# Patient Record
Sex: Female | Born: 1995
Health system: Southern US, Community
[De-identification: ages and names within clinical notes are randomized; demographics above are authoritative.]

## PROBLEM LIST (undated history)

## (undated) DIAGNOSIS — O24419 Gestational diabetes mellitus in pregnancy, unspecified control: Secondary | ICD-10-CM

## (undated) HISTORY — DX: Gestational diabetes mellitus in pregnancy, unspecified control: O24.419

---
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
## 2015-01-03 ENCOUNTER — Encounter: Payer: Self-pay | Admitting: Obstetrics & Gynecology

## 2015-01-03 ENCOUNTER — Ambulatory Visit (INDEPENDENT_AMBULATORY_CARE_PROVIDER_SITE_OTHER): Payer: Medicaid Other | Admitting: Obstetrics & Gynecology

## 2015-01-03 VITALS — BP 127/72 | HR 83 | Temp 98.8°F | Ht 62.0 in | Wt 159.2 lb

## 2015-01-03 DIAGNOSIS — Z01812 Encounter for preprocedural laboratory examination: Secondary | ICD-10-CM | POA: Diagnosis present

## 2015-01-03 DIAGNOSIS — Z3202 Encounter for pregnancy test, result negative: Secondary | ICD-10-CM

## 2015-01-03 DIAGNOSIS — Z30430 Encounter for insertion of intrauterine contraceptive device: Secondary | ICD-10-CM | POA: Diagnosis not present

## 2015-01-03 LAB — POCT PREGNANCY, URINE: PREG TEST UR: NEGATIVE

## 2015-01-03 MED ORDER — LEVONORGESTREL 20 MCG/24HR IU IUD
INTRAUTERINE_SYSTEM | Freq: Once | INTRAUTERINE | Status: AC
  Administered 2015-01-03: 1 via INTRAUTERINE

## 2015-01-03 NOTE — Progress Notes (Signed)
   Subjective:    Patient ID: [name] Alvarez, female    DOB: 07/04/1996, 19 y.o.   MRN: [id number]  HPI  This S AA P2 is here for Mirena insertion. She has not had sex for 2 weeks.  Review of Systems     Objective:   Physical Exam  UPT negative, consent signed, Time out procedure done. Cervix prepped with betadine and grasped with a single tooth tenaculum. Mirena was easily placed and the strings were cut to 3-4 cm. Uterus sounded to 9 cm. She tolerated the procedure well.        Assessment & Plan:  Contraception- Mirena RTC 4 weeks/prn sooner

## 2015-01-31 ENCOUNTER — Ambulatory Visit: Payer: Medicaid Other | Admitting: Obstetrics and Gynecology

## 2015-02-08 ENCOUNTER — Emergency Department (HOSPITAL_COMMUNITY)
Admission: EM | Admit: 2015-02-08 | Discharge: 2015-02-09 | Disposition: A | Discharge status: Home / Self Care | Payer: Medicaid Other | Attending: Emergency Medicine | Admitting: Emergency Medicine

## 2015-02-08 ENCOUNTER — Encounter (HOSPITAL_COMMUNITY): Payer: Self-pay | Admitting: Emergency Medicine

## 2015-02-08 DIAGNOSIS — Z3202 Encounter for pregnancy test, result negative: Secondary | ICD-10-CM | POA: Diagnosis not present

## 2015-02-08 DIAGNOSIS — B349 Viral infection, unspecified: Secondary | ICD-10-CM | POA: Diagnosis not present

## 2015-02-08 DIAGNOSIS — J029 Acute pharyngitis, unspecified: Secondary | ICD-10-CM | POA: Diagnosis not present

## 2015-02-08 LAB — POC URINE PREG, ED: Preg Test, Ur: NEGATIVE

## 2015-02-08 LAB — RAPID STREP SCREEN (MED CTR MEBANE ONLY): STREPTOCOCCUS, GROUP A SCREEN (DIRECT): NEGATIVE

## 2015-02-08 MED ORDER — ACETAMINOPHEN 325 MG PO TABS
650.0000 mg | ORAL_TABLET | Freq: Once | ORAL | Status: AC
  Administered 2015-02-08: 650 mg via ORAL
  Filled 2015-02-08: qty 2

## 2015-02-08 NOTE — ED Provider Notes (Signed)
CSN: [id number]     Arrival date & time 02/08/15  1951 History  This chart was scribed for [employer], working with Rolland Porter, MD by Placido Sou, ED Scribe. This patient was seen in room TR04C/TR04C and the patient's care was started at 11:22 PM.     Chief Complaint  Patient presents with  . Sore Throat    The history is provided by the patient. No language interpreter was used.    HPI Comments: [name] Alvarez is a 19 y.o. female who presents to the Emergency Department complaining of a constant, moderate, sore throat with onset this morning (TMAX 101.8). Reported that she's been experiencing nasal congestion as well. Pt denies taking any medication before arrival. Pt's daughter was seen at [facility] 2 days ago and was diagnosed with URI. Pt denies coughing, neck pain, neck stiffness, trouble swallowing, ear pain, CP, SOB, recent travels, blurred vision, sudden loss of vision, ear drainage, eye drainage.  PCP Dr. Julio Sicks  History reviewed. No pertinent past medical history. Past Surgical History  Procedure Laterality Date  . Cesarean section  2013    [place]  . Cesarean section N/A 11/13/2014    Procedure: CESAREAN SECTION;  Surgeon: Catalina Antigua, MD;  Location: [facility];  Service: Obstetrics;  Laterality: N/A;   No family history on file. History  Substance Use Topics  . Smoking status: Never Smoker   . Smokeless tobacco: Never Used  . Alcohol Use: No   OB History    Gravida Para Term Preterm AB TAB SAB Ectopic Multiple Living   0 0 0 0 0 0 2     Review of Systems  Constitutional: Positive for fever. Negative for chills.  HENT: Positive for congestion and sore throat. Negative for ear discharge, ear pain, sinus pressure and trouble swallowing.   Eyes: Negative for discharge.  Respiratory: Negative for cough and shortness of breath.   Cardiovascular: Negative for chest pain.  Musculoskeletal: Negative for neck pain and neck stiffness.      Allergies   Review of patient's allergies indicates no known allergies.  Home Medications   Prior to Admission medications   Medication Sig Start Date End Date Taking? Authorizing Provider  acetaminophen (TYLENOL) 325 MG tablet Take 1 tablet (325 mg total) by mouth every 8 (eight) hours as needed for fever. 02/09/15   Noble Cicalese, PA-C   BP 113/69 mmHg  Pulse 97  Temp(Src) 99.7 F (37.6 C) (Oral)  Resp 18  Ht  (1.575 m)  Wt 160 lb (72.576 kg)  BMI 29.26 kg/m2  SpO2 100% Physical Exam  Constitutional: She is oriented to person, place, and time. She appears well-developed and well-nourished. No distress.  HENT:  Head: Normocephalic and atraumatic.  Right Ear: Hearing, tympanic membrane, external ear and ear canal normal. No mastoid tenderness. Tympanic membrane is not injected, not scarred, not perforated, not erythematous, not retracted and not bulging.  Left Ear: Hearing, tympanic membrane, external ear and ear canal normal. No mastoid tenderness. Tympanic membrane is not injected, not scarred, not perforated, not erythematous, not retracted and not bulging.  Mouth/Throat: Oropharynx is clear and moist. No oropharyngeal exudate.  Mild erythema identified to the posterior oropharynx. Mild erythema identified to the tonsils bilaterally. Negative tonsillar adenopathy. Negative exudate noted. Negative petechiae noted to the soft palate. Uvula midline was matched elevation. Negative uvula deviation. Negative trismus. Negative sublingual lesions.  Eyes: Conjunctivae and EOM are normal. Pupils are equal, round, and reactive to light.  Right eye exhibits no discharge. Left eye exhibits no discharge.  Neck: Normal range of motion. Neck supple. No tracheal deviation present.  Negative neck stiffness Negative nuchal rigidity Negative cervical lymphadenopathy Negative meningeal signs  Cardiovascular: Normal rate, regular rhythm and normal heart sounds.  Exam reveals no friction rub.   No murmur  heard. Pulses:      Radial pulses are 2+ on the right side, and 2+ on the left side.  Pulmonary/Chest: Effort normal and breath sounds normal. No respiratory distress. She has no wheezes. She has no rales.  Patient is able to speak in full sentences that difficulty Negative use of accessory muscles Negative stridor  Musculoskeletal: Normal range of motion.  Lymphadenopathy:    She has no cervical adenopathy.  Neurological: She is alert and oriented to person, place, and time. No cranial nerve deficit. She exhibits normal muscle tone. Coordination normal. GCS eye subscore is 4. GCS verbal subscore is 5. GCS motor subscore is 6.  Skin: Skin is warm and dry. No rash noted. She is not diaphoretic. No erythema.  Psychiatric: She has a normal mood and affect. Her behavior is normal. Thought content normal.  Nursing note and vitals reviewed.   ED Course  Procedures  DIAGNOSTIC STUDIES: Oxygen Saturation is 100% on RA, normal by my interpretation.    COORDINATION OF CARE: 11:29 PM Discussed treatment plan with pt at bedside rest, hydration and treating pain with ibuprofen and tylenol. Pt agreed to plan.  Results for orders placed or performed during the hospital encounter of 02/08/15  Rapid strep screen  Result Value Ref Range   Streptococcus, Group A Screen (Direct) NEGATIVE NEGATIVE  POC urine preg, ED ([facility])  Result Value Ref Range   Preg Test, Ur NEGATIVE NEGATIVE    Labs Review Labs Reviewed  RAPID STREP SCREEN ([facility])  CULTURE, GROUP A STREP  POC URINE PREG, ED    Imaging Review No results found.   EKG Interpretation None      MDM   Final diagnoses:  Sore throat  Viral syndrome    Medications  acetaminophen (TYLENOL) tablet 650 mg (650 mg Oral Given 02/08/15 2010)    Filed Vitals:   02/08/15 1959 02/08/15 2352  BP: 113/71 113/69  Pulse: 108 97  Temp: 101.4 F (38.6 C) 99.7 F (37.6 C)  TempSrc: Oral Oral  Resp: 16 18  Height: 5\' 2"  (1.575  m)   Weight: 160 lb (72.576 kg)   SpO2: 100% 100%   I personally performed the services described in this documentation, which was scribed in my presence. The recorded information has been reviewed and is accurate.   Patient presenting to the ED with nasal congestion, sore throat and fever that started earlier this morning. For that her maximum temperature was 101.25F. Stated that she has used nothing for fever control at home. Stated her daughter has similar symptoms as recently diagnosed with a viral upper respiratory infection 2 days ago. Urine pregnancy negative. Rapid strep test negative. Doubt peritonsillar abscess. Doubt retropharyngeal abscess. Doubt meningitis. Doubt Ludwig's angina. Doubt streptococcal pharyngitis. Mild erythema identified to the oropharynx and posterior oropharynx-negative exudate or petechiae noted on examination. Negative trismus. Patient mildly febrile upon arrival to the ED, patient given Tylenol with relief of fever. Patient stable, afebrile. Patient not septic appearing. Negative signs of respiratory distress. Suspicion to be viral syndrome, upper respiratory infection. Discharged patient. Discharge patient with fever control medications. Discussed with patient to rest and stay hydrated. Referred  patient to PCP. Discussed with patient to closely monitor symptoms and if symptoms are to worsen or change to report back to the ED - strict return instructions given.  Patient agreed to plan of care, understood, all questions answered.    Raymon Mutton, PA-C 02/09/15 0031  Rolland Porter, MD 02/16/15 1330

## 2015-02-08 NOTE — ED Notes (Signed)
Pt. reports sore throat with fever onset last night with mild headache . Respirations unlabored /airway intact .

## 2015-02-09 MED ORDER — ACETAMINOPHEN 325 MG PO TABS: 325.0000 mg | ORAL_TABLET | Freq: Three times a day (TID) | ORAL | Status: DC | PRN

## 2015-02-09 NOTE — Discharge Instructions (Signed)
Please call your doctor for a followup appointment within 24-48 hours. When you talk to your doctor please let them know that you were seen in the emergency department and have them acquire all of your records so that they can discuss the findings with you and formulate a treatment plan to fully care for your new and ongoing problems. Please follow-up with her primary care provider Please rest and stay hydrated Please closely monitor your fever and use Tylenol as needed. Please continue to monitor symptoms closely and if symptoms are to worsen or change (fever greater than 101, chills, sweating, nausea, vomiting, chest pain, shortness of breathe, difficulty breathing, weakness, numbness, tingling, worsening or changes to pain pattern, headache, dizziness, blurred vision, sudden loss of vision, neck pain, neck stiffness, inability to swallow, coughing up blood, cough, inability swallow) please report back to the Emergency Department immediately.   Sore Throat A sore throat is pain, burning, irritation, or scratchiness of the throat. There is often pain or tenderness when swallowing or talking. A sore throat may be accompanied by other symptoms, such as coughing, sneezing, fever, and swollen neck glands. A sore throat is often the first sign of another sickness, such as a cold, flu, strep throat, or mononucleosis (commonly known as mono). Most sore throats go away without medical treatment. CAUSES  The most common causes of a sore throat include:  A viral infection, such as a cold, flu, or mono.  A bacterial infection, such as strep throat, tonsillitis, or whooping cough.  Seasonal allergies.  Dryness in the air.  Irritants, such as smoke or pollution.  Gastroesophageal reflux disease (GERD). HOME CARE INSTRUCTIONS   Only take over-the-counter medicines as directed by your caregiver.  Drink enough fluids to keep your urine clear or pale yellow.  Rest as needed.  Try using throat sprays,  lozenges, or sucking on hard candy to ease any pain (if older than 4 years or as directed).  Sip warm liquids, such as broth, herbal tea, or warm water with honey to relieve pain temporarily. You may also eat or drink cold or frozen liquids such as frozen ice pops.  Gargle with salt water (mix 1 tsp salt with 8 oz of water).  Do not smoke and avoid secondhand smoke.  Put a cool-mist humidifier in your bedroom at night to moisten the air. You can also turn on a hot shower and sit in the bathroom with the door closed for 5-10 minutes. SEEK IMMEDIATE MEDICAL CARE IF:  You have difficulty breathing.  You are unable to swallow fluids, soft foods, or your saliva.  You have increased swelling in the throat.  Your sore throat does not get better in 7 days.  You have nausea and vomiting.  You have a fever or persistent symptoms for more than 2-3 days.  You have a fever and your symptoms suddenly get worse. MAKE SURE YOU:   Understand these instructions.  Will watch your condition.  Will get help right away if you are not doing well or get worse. Document Released: 10/09/2004 Document Revised: 08/18/2012 Document Reviewed: 05/09/2012 [facility] Patient Information 2015 [employer], [place]. This information is not intended to replace advice given to you by your health care provider. Make sure you discuss any questions you have with your health care provider.  Viral Infections A virus is a type of germ. Viruses can cause:  Minor sore throats.  Aches and pains.  Headaches.  Runny nose.  Rashes.  Watery eyes.  Tiredness.  Coughs.  Loss of appetite.  Feeling sick to your stomach (nausea).  Throwing up (vomiting).  Watery poop (diarrhea). HOME CARE   Only take medicines as told by your doctor.  Drink enough water and fluids to keep your pee (urine) clear or pale yellow. Sports drinks are a good choice.  Get plenty of rest and eat healthy. Soups and broths with crackers or  rice are fine. GET HELP RIGHT AWAY IF:   You have a very bad headache.  You have shortness of breath.  You have chest pain or neck pain.  You have an unusual rash.  You cannot stop throwing up.  You have watery poop that does not stop.  You cannot keep fluids down.  You or your child has a temperature by mouth above 102 F (38.9 C), not controlled by medicine.  Your baby is older than 3 months with a rectal temperature of 102 F (38.9 C) or higher.  Your baby is 71 months old or younger with a rectal temperature of 100.4 F (38 C) or higher. MAKE SURE YOU:   Understand these instructions.  Will watch this condition.  Will get help right away if you are not doing well or get worse. Document Released: 08/14/2008 Document Revised: 11/24/2011 Document Reviewed: 01/07/2011 [facility] Patient Information 2015 [place], [place]. This information is not intended to replace advice given to you by your health care provider. Make sure you discuss any questions you have with your health care provider.

## 2015-02-11 LAB — CULTURE, GROUP A STREP: STREP A CULTURE: NEGATIVE

## 2016-06-13 IMAGING — US US OB COMP +14 WK
1 series · 12 of 28 positions shown · non-contrast
Comparison: none

[Series 1: us ob comp +14 wk mfm · 112 acquisitions, 12 frames shown]
[im 5/112]
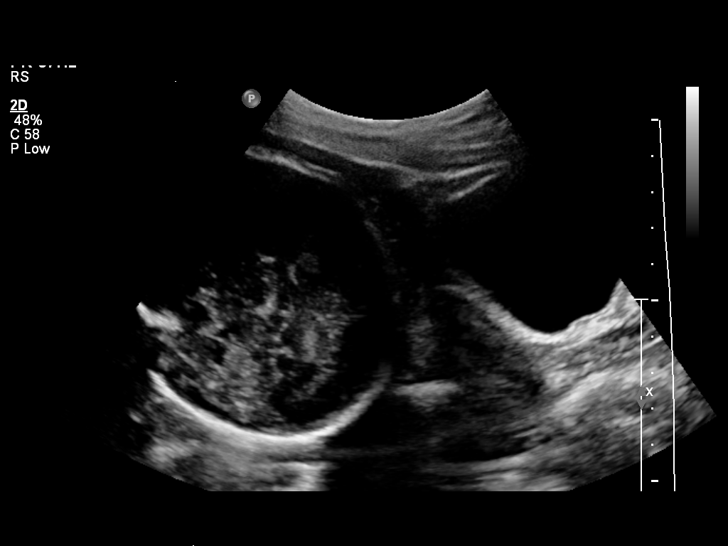
[im 13/112]
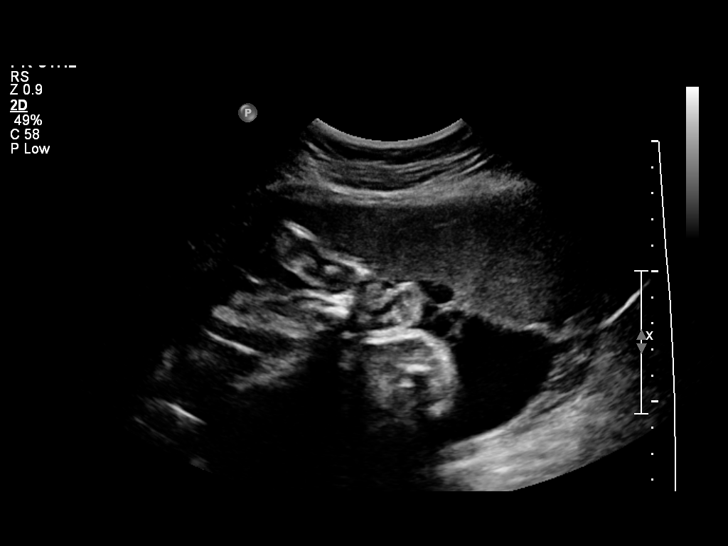
[im 21/112]
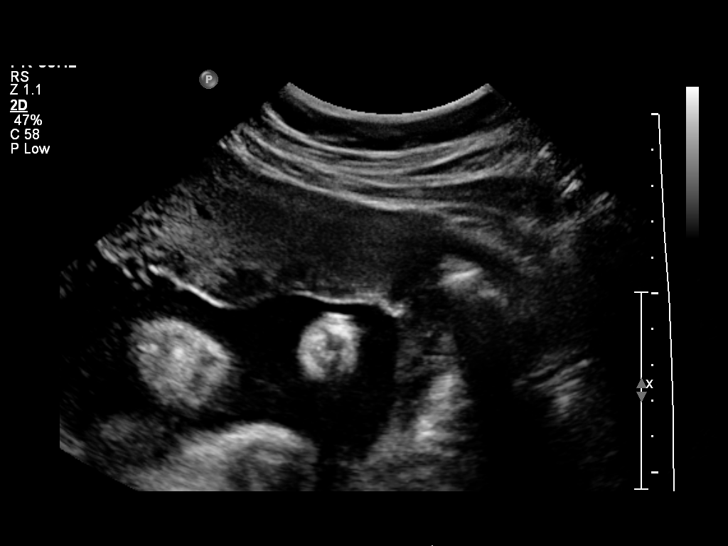
[im 33/112]
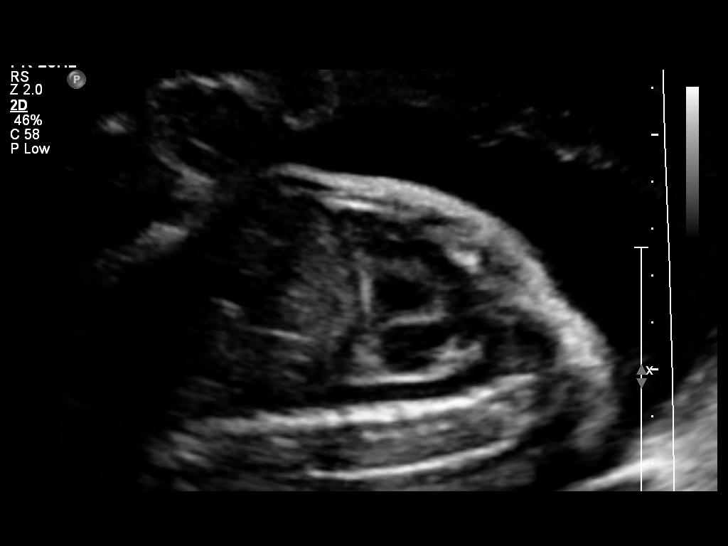
[im 42/112]
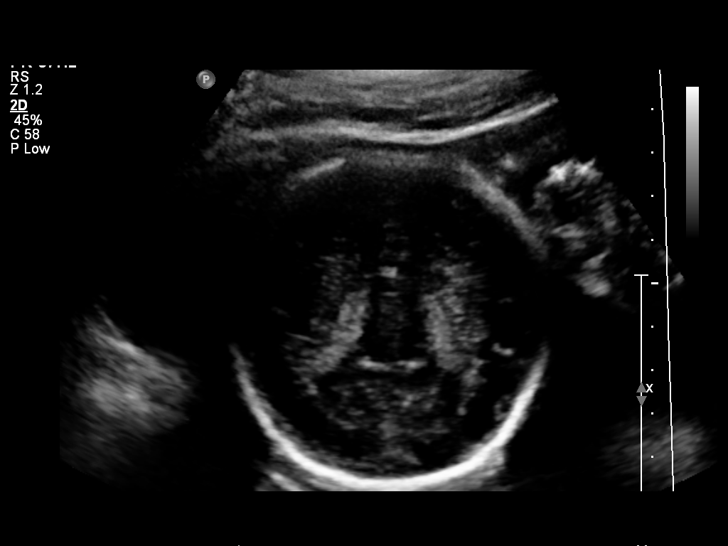
[im 50/112]
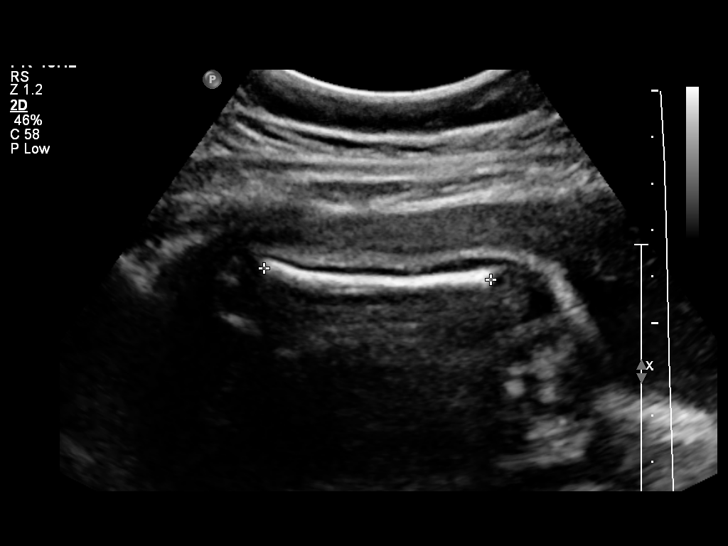
[im 62/112]
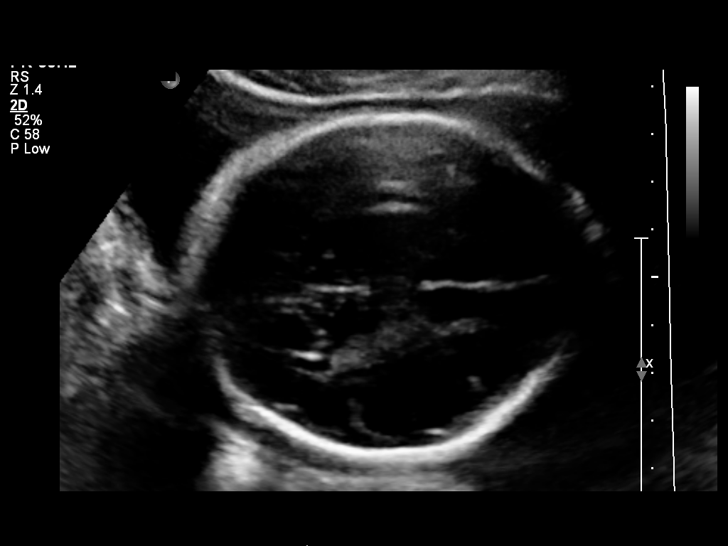
[im 70/112]
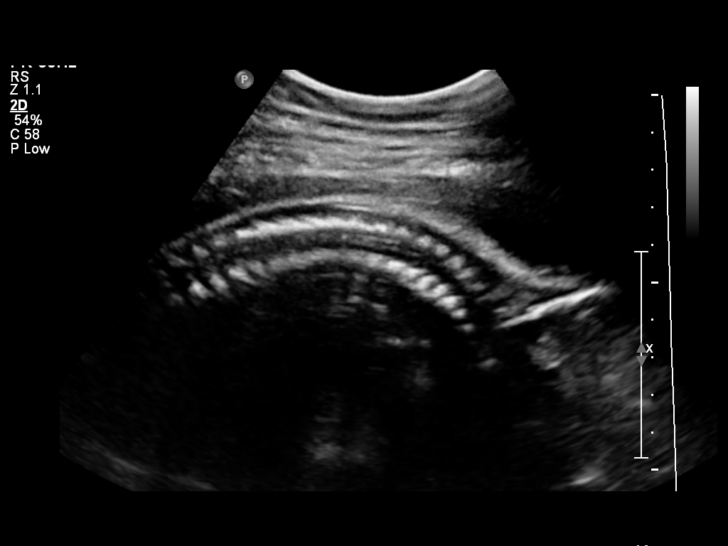
[im 79/112]
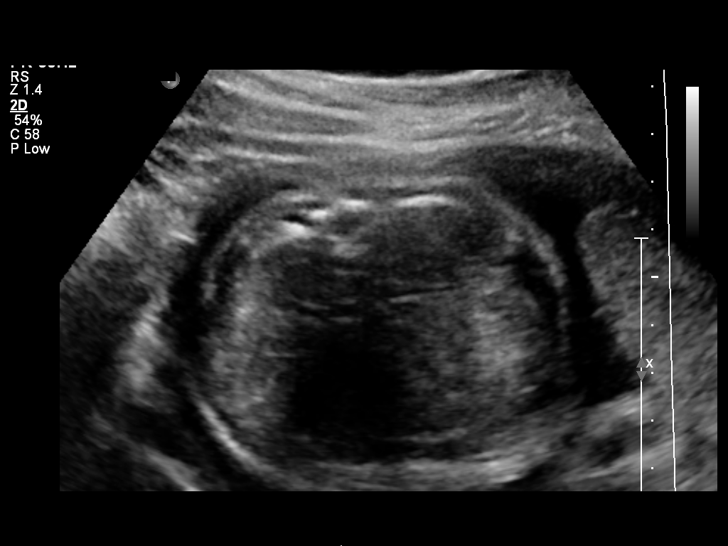
[im 91/112]
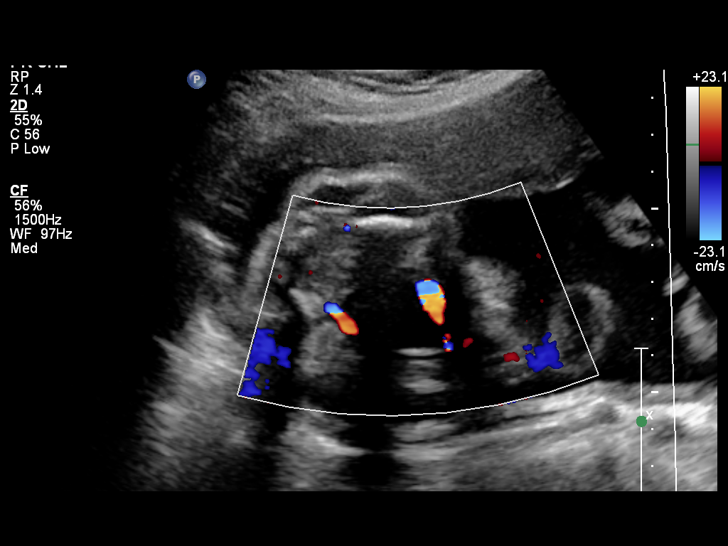
[im 99/112]
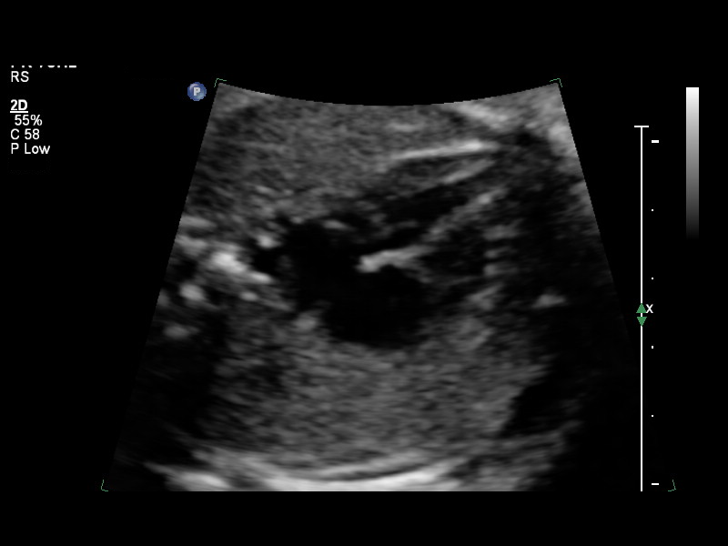
[im 107/112]
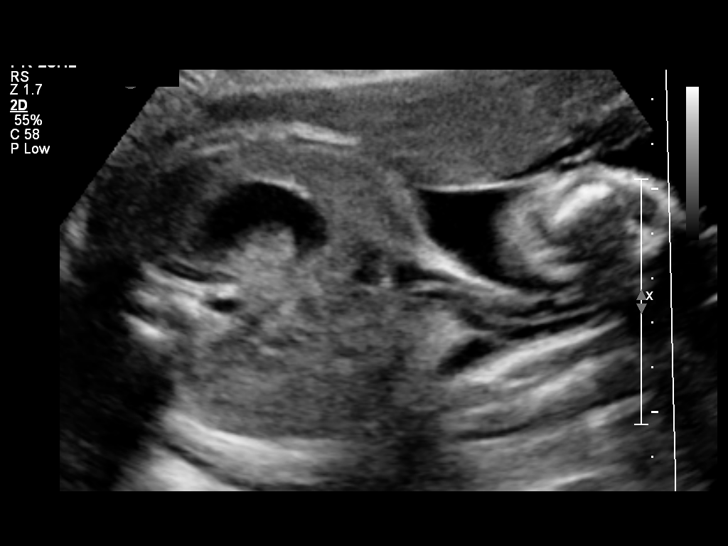

[12 of 28 positions shown; findings below may reference images not displayed]

OBSTETRICS REPORT
                      (Signed Final 09/07/2014 [DATE])

Service(s) Provided

 US OB COMP + 14 WK                                    76805.1
Indications

 29 weeks gestation of pregnancy
 No or Little Prenatal Care
 Basic anatomic survey                                 [id number]
Fetal Evaluation

 Num Of Fetuses:    1
 Fetal Heart Rate:  128                          bpm
 Cardiac Activity:  Observed
 Presentation:      Cephalic
 Placenta:          Anterior, above cervical os
 P. Cord            Visualized, central
 Insertion:

 Amniotic Fluid
 AFI FV:      Subjectively within normal limits
                                             Larg Pckt:    5.09  cm
Biometry

 BPD:     69.5  mm     G. Age:  27w 6d                CI:        75.62   70 - 86
                                                      FL/HC:      22.3   19.6 -

 HC:     253.4  mm     G. Age:  27w 4d      < 3  %    HC/AC:      0.98   0.99 -

 AC:     259.2  mm     G. Age:  30w 1d       67  %    FL/BPD:     81.4   71 - 87
 FL:      56.6  mm     G. Age:  29w 5d       48  %    FL/AC:      21.8   20 - 24
 HUM:     48.9  mm     G. Age:  28w 6d       34  %

 Est. FW:    9092  gm      3 lb 2 oz     56  %
Gestational Age

 LMP:           29w 2d        Date:  02/13/14                 EDD:   11/20/14
 U/S Today:     28w 6d                                        EDD:   11/23/14
 Best:          29w 2d     Det. By:  LMP  (02/13/14)          EDD:   11/20/14
Anatomy

 Cranium:          Appears normal         Aortic Arch:      Appears normal
 Fetal Cavum:      Appears normal         Ductal Arch:      Appears normal
 Ventricles:       Appears normal         Diaphragm:        Appears normal
 Choroid Plexus:   Appears normal         Stomach:          Appears normal, left
                                                            sided
 Cerebellum:       Appears normal         Abdomen:          Appears normal
 Posterior Fossa:  Appears normal         Abdominal Wall:   Appears nml (cord
                                                            insert, abd wall)
 Nuchal Fold:      Not applicable (>20    Cord Vessels:     Appears normal (3
                   wks GA)                                  vessel cord)
 Face:             Profile appears        Kidneys:          Appear normal
                   normal
 Lips:             Appears normal         Bladder:          Appears normal
 Heart:            Appears normal         Spine:            Appears normal
                   (4CH, axis, and
                   situs)
 RVOT:             Appears normal         Lower             Appears normal
                                          Extremities:
 LVOT:             Appears normal         Upper             Appears normal
                                          Extremities:

 Other:  Technicallly difficult due to advanced GA and maternal habitus.
         Technically difficult due to fetal position. Fetus appears to be a female.
Cervix Uterus Adnexa

 Cervical Length:    3.9      cm

 Cervix:       Not visualized (advanced GA >86wks)
 Uterus:       No abnormality visualized.
 Cul De Sac:   No free fluid seen.

 Left Ovary:    Within normal limits.
 Right Ovary:   Within normal limits.
 Adnexa:     No abnormality visualized.
Impression

 SIUP at 29+6 weeks
 Normal detailed fetal anatomy
 Normal amniotic fluid volume
 Measurements consistent with LMP dating; EFW at the 56th
 %tile
Recommendations

 Follow-up as clinically indicated

 questions or concerns.

## 2017-11-08 ENCOUNTER — Emergency Department (HOSPITAL_COMMUNITY)
Admission: EM | Admit: 2017-11-08 | Discharge: 2017-11-08 | Disposition: A | Discharge status: Home / Self Care | Payer: Self-pay | Attending: Emergency Medicine | Admitting: Emergency Medicine

## 2017-11-08 ENCOUNTER — Encounter (HOSPITAL_COMMUNITY): Payer: Self-pay

## 2017-11-08 DIAGNOSIS — J029 Acute pharyngitis, unspecified: Secondary | ICD-10-CM | POA: Insufficient documentation

## 2017-11-08 DIAGNOSIS — J069 Acute upper respiratory infection, unspecified: Secondary | ICD-10-CM | POA: Insufficient documentation

## 2017-11-08 DIAGNOSIS — B9789 Other viral agents as the cause of diseases classified elsewhere: Secondary | ICD-10-CM | POA: Insufficient documentation

## 2017-11-08 LAB — RAPID STREP SCREEN (MED CTR MEBANE ONLY): Streptococcus, Group A Screen (Direct): NEGATIVE

## 2017-11-08 MED ORDER — FLUTICASONE PROPIONATE 50 MCG/ACT NA SUSP: 2.0000 | Freq: Every day | NASAL | 0 refills | Status: DC

## 2017-11-08 MED ORDER — BENZONATATE 100 MG PO CAPS: 100.0000 mg | ORAL_CAPSULE | Freq: Three times a day (TID) | ORAL | 0 refills | Status: DC

## 2017-11-08 MED ORDER — GUAIFENESIN ER 600 MG PO TB12: 600.0000 mg | ORAL_TABLET | Freq: Two times a day (BID) | ORAL | 0 refills | Status: DC

## 2017-11-08 NOTE — Discharge Instructions (Signed)
Today you were seen for sore throat.  Your rapid strep test was negative.  A strep culture was sent, and if the results of this culture are positive the hospital will contact you to start you on any medicines that you may need at that time.  You are given a prescription for benzonatate which he can use for your cough.  You are also given a prescription for Mucinex which you can use for your chest congestion.  You are also given a prescription for Flonase which you may use for your nasal congestion.  Over the next several days you should rest as much as possible, and drink more fluids than usual. Liquids will help thin and loosen mucus so you can cough it up. Liquids will also help prevent dehydration. Using a cool mist humidifier or a vaporizer to increase air moisture in your home can also make it easier for you to breathe and help decrease your cough.  To help soothe a sore throat gargle with warm salt water.  Make salt water by dissolving  teaspoon salt in 1 cup warm water. You may also use throat lozenges and over the counter sore throat spray.  Please follow up with your primary care provider within 5-7 days for re-evaluation of your symptoms. If you do not have a primary care provider, information for a healthcare clinic has been provided for you to make arrangements for follow up care. Please return to the emergency department for any persistent fevers, worsening sore throat/hoarse voice, inability to swallow, persistent vomiting, chest pain, shortness of breath, coughing up blood, or any new or worsening symptoms.

## 2017-11-08 NOTE — ED Notes (Signed)
Declined W/C at D/C and was escorted to lobby by RN. 

## 2017-11-08 NOTE — ED Triage Notes (Signed)
Pt presents for evaluation of sore throat since thursday. Reports mild cough, denies fever.

## 2017-11-08 NOTE — ED Provider Notes (Signed)
[facility] Provider Note   CSN: [id number] Arrival date & time: 11/08/17  1424     History   Chief Complaint Chief Complaint  Patient presents with  . Sore Throat    HPI [name] Alvarez is a 22 y.o. female.  HPI   Pt is a 22 y/o female who prsents to the ed today c/o a  Sore throat that began 4 days ago. States that she only has a sore throat when she swallows. Pain is on both sides. Pain is worse in the morning and at night. Rate 8-9/10 in morning and night, and during day 5/10. She also reports nasal congestion, rhinorrhea, postnasal drip for the same time period. She also reports a cough with yellow sputum. No hemoptysis. No chest pain, shortness of breath, or fevers. No ear pain, eye pain. Has had intermittent headaches over the last few days which are consistent with her typical headaches. No headache now. No vision changes, neck pain/stiffness.   History reviewed. No pertinent past medical history.  Patient Active Problem List   Diagnosis Date Noted  . S/P cesarean section 11/13/2014  . Chlamydia infection affecting pregnancy in third trimester, antepartum 09/14/2014  . Insufficient prenatal care in third trimester 09/13/2014  . History of cesarean section complicating pregnancy 09/13/2014  . Sickle cell trait (HCC) 09/11/2014  . Encounter for routine screening for malformation using ultrasonics   . Rubella nonimmune 09/04/2014    Past Surgical History:  Procedure Laterality Date  . [place] SECTION  2013   [place]  . CESAREAN SECTION N/A 11/13/2014   Procedure: CESAREAN SECTION;  Surgeon: Catalina AntiguaPeggy Constant, MD;  Location: [facility];  Service: Obstetrics;  Laterality: N/A;    OB History    Gravida Para Term Preterm AB Living   2 2 2  0 0 2   SAB TAB Ectopic Multiple Live Births   0 0 0 0 2       Home Medications    Prior to Admission medications   Medication Sig Start Date End Date Taking? Authorizing Provider  acetaminophen  (TYLENOL) 325 MG tablet Take 1 tablet (325 mg total) by mouth every 8 (eight) hours as needed for fever. 02/09/15   Sciacca, Marissa, PA-C  benzonatate (TESSALON) 100 MG capsule Take 1 capsule (100 mg total) by mouth every 8 (eight) hours. 11/08/17   Mazie Fencl S, PA-C  fluticasone (FLONASE) 50 MCG/ACT nasal spray Place 2 sprays into both nostrils daily. 11/08/17   Stefania Goulart S, PA-C  guaiFENesin (MUCINEX) 600 MG 12 hr tablet Take 1 tablet (600 mg total) by mouth 2 (two) times daily. 11/08/17   Liev Brockbank S, PA-C    Family History No family history on file.  Social History Social History   Tobacco Use  . Smoking status: Never Smoker  . Smokeless tobacco: Never Used  Substance Use Topics  . Alcohol use: No  . Drug use: No     Allergies   Patient has no known allergies.   Review of Systems Review of Systems  Constitutional: Negative for chills and fever.  HENT: Positive for congestion, postnasal drip, rhinorrhea and sore throat. Negative for ear pain and trouble swallowing.   Eyes: Negative for pain and visual disturbance.  Respiratory: Positive for cough. Negative for shortness of breath and wheezing.   Cardiovascular: Negative for chest pain, palpitations and leg swelling.  Gastrointestinal: Negative for abdominal pain, constipation, diarrhea, nausea and vomiting.  Genitourinary: Negative for dysuria and hematuria.  Musculoskeletal: Negative  for myalgias, neck pain and neck stiffness.  Skin: Negative for rash.  Neurological: Positive for headaches (resolved).  All other systems reviewed and are negative.    Physical Exam Updated Vital Signs BP 113/67 (BP Location: Right Arm)   Pulse 81   Temp 98.6 F (37 C) (Oral)   Resp 16   Ht 5\' 2"  (1.575 m)   Wt 81.6 kg (180 lb)   SpO2 99%   BMI 32.92 kg/m   Physical Exam  Constitutional: She appears well-developed and well-nourished. No distress.  Nontoxic, no acute distress.  HENT:  Head: Normocephalic and  atraumatic.  Bilateral TMs normal.  Mild pharyngeal erythema.  No tonsillar swelling or exudates.  Uvula midline.  No evidence of peritonsillar abscess or retropharyngeal abscess.  No hot potato voice.  Eyes: Conjunctivae and EOM are normal. Pupils are equal, round, and reactive to light.  Neck: Normal range of motion. Neck supple.  Cardiovascular: Normal rate, regular rhythm and normal heart sounds.  No murmur heard. Pulmonary/Chest: Effort normal. No respiratory distress. She has no wheezes. She has no rhonchi. She has no rales.  Dry cough on exam.  Abdominal: Soft. There is no tenderness.  Musculoskeletal: She exhibits no edema.  Lymphadenopathy:    She has no cervical adenopathy.  Neurological: She is alert.  Skin: Skin is warm and dry. Capillary refill takes less than 2 seconds.  Psychiatric: She has a normal mood and affect.  Nursing note and vitals reviewed.    ED Treatments / Results  Labs (all labs ordered are listed, but only abnormal results are displayed) Labs Reviewed  RAPID STREP SCREEN (NOT AT [facility])  CULTURE, GROUP A STREP [facility])    EKG  EKG Interpretation None       Radiology No results found.  Procedures Procedures (including critical care time)  Medications Ordered in ED Medications - No data to display   Initial Impression / Assessment and Plan / ED Course  I have reviewed the triage vital signs and the nursing notes.  Pertinent labs & imaging results that were available during my care of the patient were reviewed by me and considered in my medical decision making (see chart for details).      Final Clinical Impressions(s) / ED Diagnoses   Final diagnoses:  Sore throat  Viral URI with cough    Lung sounds are clear.  Vital signs stable and she is afebrile.  Nontoxic-appearing.  No indication for chest x-ray at this time.  Patients symptoms are consistent with URI, likely viral etiology. Discussed that antibiotics are not indicated for  viral infections. Pt will be discharged with symptomatic treatment.  Verbalizes understanding and is agreeable with plan. Pt is hemodynamically stable & in NAD prior to dc.   ED Discharge Orders        Ordered    fluticasone (FLONASE) 50 MCG/ACT nasal spray  Daily     11/08/17 1539    guaiFENesin (MUCINEX) 600 MG 12 hr tablet  2 times daily     11/08/17 1539    benzonatate (TESSALON) 100 MG capsule  Every 8 hours     11/08/17 1539       Sevilla Murtagh S, PA-C 11/08/17 1543    Doug Sou, MD 11/09/17 [phone]

## 2017-11-10 LAB — CULTURE, GROUP A STREP (THRC)

## 2018-10-05 ENCOUNTER — Emergency Department (HOSPITAL_COMMUNITY)
Admission: EM | Admit: 2018-10-05 | Discharge: 2018-10-06 | Disposition: A | Discharge status: Home / Self Care | Payer: Self-pay | Attending: Emergency Medicine | Admitting: Emergency Medicine

## 2018-10-05 ENCOUNTER — Encounter (HOSPITAL_COMMUNITY): Payer: Self-pay | Admitting: Emergency Medicine

## 2018-10-05 DIAGNOSIS — J111 Influenza due to unidentified influenza virus with other respiratory manifestations: Secondary | ICD-10-CM

## 2018-10-05 DIAGNOSIS — R69 Illness, unspecified: Secondary | ICD-10-CM

## 2018-10-05 MED ORDER — ACETAMINOPHEN 325 MG PO TABS
650.0000 mg | ORAL_TABLET | Freq: Once | ORAL | Status: AC | PRN
  Administered 2018-10-05: 650 mg via ORAL
  Filled 2018-10-05: qty 2

## 2018-10-05 NOTE — ED Triage Notes (Signed)
Pt reports flu like symptoms that started Saturday night including non-productive cough, generalized body aches, sore throat, chills, headache.  Fever in triage

## 2018-10-06 MED ORDER — BENZONATATE 100 MG PO CAPS
100.0000 mg | ORAL_CAPSULE | Freq: Once | ORAL | Status: AC
  Administered 2018-10-06: 100 mg via ORAL
  Filled 2018-10-06: qty 1

## 2018-10-06 MED ORDER — BENZONATATE 100 MG PO CAPS: 200.0000 mg | ORAL_CAPSULE | Freq: Two times a day (BID) | ORAL | 0 refills | Status: DC | PRN

## 2018-10-06 NOTE — ED Provider Notes (Signed)
[facility] Provider Note   CSN: [id number] Arrival date & time: 10/05/18  1959     History   Chief Complaint Chief Complaint  Patient presents with  . flu like symptoms    HPI [name] Alvarez is a 23 y.o. female.  Patient presents to the emergency department with a chief complaint of flulike symptoms.  She reports associated fever, chills, sore throat, and nonproductive cough, and generalized body aches.  She states that her symptoms started days ago.  She has tried OTC cough and cold medicine with little relief.  She states that her chest hurts when she coughs.  She denies any other associated symptoms.  The history is provided by the patient. No language interpreter was used.    History reviewed. No pertinent past medical history.  Patient Active Problem List   Diagnosis Date Noted  . S/P cesarean section 11/13/2014  . Chlamydia infection affecting pregnancy in third trimester, antepartum 09/14/2014  . Insufficient prenatal care in third trimester 09/13/2014  . History of cesarean section complicating pregnancy 09/13/2014  . Sickle cell trait (HCC) 09/11/2014  . Encounter for routine screening for malformation using ultrasonics   . Rubella nonimmune 09/04/2014    Past Surgical History:  Procedure Laterality Date  . CESAREAN SECTION  2013   [place]  . CESAREAN SECTION N/A 11/13/2014   Procedure: CESAREAN SECTION;  Surgeon: Catalina AntiguaPeggy Constant, MD;  Location: [facility];  Service: Obstetrics;  Laterality: N/A;     OB History    Gravida  2   Para  2   Term  2   Preterm  0   AB  0   Living  2     SAB  0   TAB  0   Ectopic  0   Multiple  0   Live Births  2            Home Medications    Prior to Admission medications   Medication Sig Start Date End Date Taking? Authorizing Provider  acetaminophen (TYLENOL) 325 MG tablet Take 1 tablet (325 mg total) by mouth every 8 (eight) hours as needed for fever. 02/09/15   Sciacca,  Marissa, PA-C  benzonatate (TESSALON) 100 MG capsule Take 1 capsule (100 mg total) by mouth every 8 (eight) hours. 11/08/17   Couture, Cortni S, PA-C  fluticasone (FLONASE) 50 MCG/ACT nasal spray Place 2 sprays into both nostrils daily. 11/08/17   Couture, Cortni S, PA-C  guaiFENesin (MUCINEX) 600 MG 12 hr tablet Take 1 tablet (600 mg total) by mouth 2 (two) times daily. 11/08/17   Couture, Cortni S, PA-C    Family History No family history on file.  Social History Social History   Tobacco Use  . Smoking status: Never Smoker  . Smokeless tobacco: Never Used  Substance Use Topics  . Alcohol use: No  . Drug use: No     Allergies   Patient has no known allergies.   Review of Systems Review of Systems  All other systems reviewed and are negative.    Physical Exam Updated Vital Signs BP 122/69   Pulse 94   Temp 99.4 F (37.4 C) (Oral)   Resp 18   SpO2 100%   Physical Exam  Nursing note and vitals reviewed.  Constitutional: Appears well-developed and well-nourished. No distress.  HENT: Oropharynx is mildly erythematous, no tonsillar exudates or abscess, airway intact. Eyes: Conjunctivae are normal. Pupils are equal, round, and reactive to light.  Neck: Normal  range of motion and full passive range of motion without pain.  Cardiovascular: Normal rate and intact distal pulses.   Pulmonary/Chest: Effort normal and breath sounds normal. No stridor. No wheezes, rhonchi, or rales. Abdominal: Soft. There is no focal abdominal tenderness.  Musculoskeletal: Normal range of motion. Moves all extremities. Neurological: Pt is alert and oriented to person, place, and time. Sensation and strength grossly intact throughout. Skin: Skin is warm and dry. No rash noted. Pt is not diaphoretic.  Psychiatric: Normal mood and affect.   ED Treatments / Results  Labs (all labs ordered are listed, but only abnormal results are displayed) Labs Reviewed - No data to  display  EKG None  Radiology No results found.  Procedures Procedures (including critical care time)  Medications Ordered in ED Medications  benzonatate (TESSALON) capsule 100 mg (has no administration in time range)  acetaminophen (TYLENOL) tablet 650 mg (650 mg Oral Given 10/05/18 2113)     Initial Impression / Assessment and Plan / ED Course  I have reviewed the triage vital signs and the nursing notes.  Pertinent labs & imaging results that were available during my care of the patient were reviewed by me and considered in my medical decision making (see chart for details).    Patient with flulike symptoms.  Lung sounds are clear to auscultation, doubt pneumonia.  Patient is outside of the treatment window for flu, recommend supportive therapies.  Prescribed Tessalon Perles for cough.  Final Clinical Impressions(s) / ED Diagnoses   Final diagnoses:  Influenza-like illness    ED Discharge Orders         Ordered    benzonatate (TESSALON) 100 MG capsule  2 times daily PRN     10/06/18 0124           Roxy Horseman, PA-C 10/06/18 0441    Dione Booze, MD 10/06/18 [phone]

## 2018-10-06 NOTE — ED Notes (Signed)
Patient verbalizes understanding of medications and discharge instructions. No further questions at this time. VSS and patient ambulatory at discharge.   

## 2020-04-15 ENCOUNTER — Ambulatory Visit (HOSPITAL_COMMUNITY)
Admission: EM | Admit: 2020-04-15 | Discharge: 2020-04-15 | Disposition: A | Discharge status: Home / Self Care | Payer: Self-pay | Attending: Family Medicine | Admitting: Family Medicine

## 2020-04-15 ENCOUNTER — Encounter (HOSPITAL_COMMUNITY): Payer: Self-pay

## 2020-04-15 ENCOUNTER — Other Ambulatory Visit: Payer: Self-pay

## 2020-04-15 ENCOUNTER — Ambulatory Visit (HOSPITAL_COMMUNITY): Payer: Self-pay

## 2020-04-15 DIAGNOSIS — J0110 Acute frontal sinusitis, unspecified: Secondary | ICD-10-CM

## 2020-04-15 MED ORDER — AZITHROMYCIN 250 MG PO TABS: 250.0000 mg | ORAL_TABLET | Freq: Every day | ORAL | 0 refills | Status: DC

## 2020-04-15 NOTE — Discharge Instructions (Addendum)
Please try things such as zyrtec-D or allegra-D which is an antihistamine and decongestant.  Please try afrin which will help with nasal congestion but use for only three days.  Please also try using a netti pot on a regular occasion. Please try honey, vick's vapor rub, lozenges and humidifer for cough and sore throat   Please follow up if your symptoms fail to improve.  

## 2020-04-15 NOTE — ED Triage Notes (Signed)
Pt c/o congestion, sinus pressure, runny nose, bilateral ear pain/pressure, cough, chills for approx 1 week. Pt reports she has tested negative twice for COVID at [facility] (1 rapid and 1 PCR). Pt reports symptoms have worsened, despite robitussin, dayquil, tylenol pm.  Denies SOB, n/v/d, abdominal pain.

## 2020-04-15 NOTE — ED Provider Notes (Signed)
[facility]    CSN: [id number] Arrival date & time: 04/15/20  1142      History   Chief Complaint Chief Complaint  Patient presents with   Otalgia   Facial Pain    HPI [name] Alvarez is a 24 y.o. female.   She is presenting with sinus congestion, ear popping and sore throat.  Her kid had a cold but subsequently got better.  Her symptoms ongoing for about 1 week.  She works at [employer].  Denies any fevers.  Symptoms have been staying the same.  HPI  History reviewed. No pertinent past medical history.  Patient Active Problem List   Diagnosis Date Noted   S/P cesarean section 11/13/2014   Chlamydia infection affecting pregnancy in third trimester, antepartum 09/14/2014   Insufficient prenatal care in third trimester 09/13/2014   History of cesarean section complicating pregnancy 09/13/2014   Sickle cell trait (HCC) 09/11/2014   Encounter for routine screening for malformation using ultrasonics    Rubella nonimmune 09/04/2014    Past Surgical History:  Procedure Laterality Date   CESAREAN SECTION  2013   [place]   CESAREAN SECTION N/A 11/13/2014   Procedure: CESAREAN SECTION;  Surgeon: Catalina Antigua, MD;  Location: [facility];  Service: Obstetrics;  Laterality: N/A;    OB History    Gravida  2   Para  2   Term  2   Preterm  0   AB  0   Living  2     SAB  0   TAB  0   Ectopic  0   Multiple  0   Live Births  2            Home Medications    Prior to Admission medications   Medication Sig Start Date End Date Taking? Authorizing Provider  acetaminophen (TYLENOL) 325 MG tablet Take 1 tablet (325 mg total) by mouth every 8 (eight) hours as needed for fever. 02/09/15   Sciacca, Marissa, PA-C  azithromycin (ZITHROMAX) 250 MG tablet Take 1 tablet (250 mg total) by mouth daily. Take first 2 tablets together, then 1 every day until finished. 04/15/20   Myra Rude, MD  guaiFENesin (MUCINEX) 600 MG 12 hr tablet Take 1 tablet (600 mg total)  by mouth 2 (two) times daily. 11/08/17   Couture, Cortni S, PA-C  fluticasone (FLONASE) 50 MCG/ACT nasal spray Place 2 sprays into both nostrils daily. 11/08/17 04/15/20  Couture, Cortni S, PA-C    Family History Family History  Problem Relation Age of Onset   Hypertension Father     Social History Social History   Tobacco Use   Smoking status: Never Smoker   Smokeless tobacco: Never Used  Substance Use Topics   Alcohol use: No   Drug use: No     Allergies   Patient has no known allergies.   Review of Systems Review of Systems  See HPI  Physical Exam Triage Vital Signs ED Triage Vitals  Enc Vitals Group     BP 04/15/20 1218 (!) 134/96     Pulse Rate 04/15/20 1218 74     Resp 04/15/20 1218 18     Temp 04/15/20 1218 98.4 F (36.9 C)     Temp Source 04/15/20 1218 Oral     SpO2 04/15/20 1218 100 %     Weight --      Height --      Head Circumference --      Peak Flow --  Pain Score 04/15/20 1215 8     Pain Loc --      Pain Edu? --      Excl. in GC? --    No data found.  Updated Vital Signs BP (!) 134/96 (BP Location: Right Arm)    Pulse 74    Temp 98.4 F (36.9 C) (Oral)    Resp 18    LMP 04/06/2020    SpO2 100%   Visual Acuity Right Eye Distance:   Left Eye Distance:   Bilateral Distance:    Right Eye Near:   Left Eye Near:    Bilateral Near:     Physical Exam Gen: NAD, alert, cooperative with exam, well-appearing ENT: normal lips, normal nasal mucosa, tympanic membranes showing bulging but no erythema or pus, normal oropharynx, no cervical lymphadenopathy Eye: normal EOM, normal conjunctiva and lids CV:  S1-S2   Resp: no accessory muscle use, non-labored, clear to auscultation bilaterally, no crackles or wheezes Skin: no rashes, no areas of induration  Neuro: normal tone, normal sensation to touch Psych:  normal insight, alert and oriented MSK: Normal gait, normal strength    UC Treatments / Results  Labs (all labs ordered are listed,  but only abnormal results are displayed) Labs Reviewed - No data to display  EKG   Radiology No results found.  Procedures Procedures (including critical care time)  Medications Ordered in UC Medications - No data to display  Initial Impression / Assessment and Plan / UC Course  I have reviewed the triage vital signs and the nursing notes.  Pertinent labs & imaging results that were available during my care of the patient were reviewed by me and considered in my medical decision making (see chart for details).     [name] is a 24 year old female is presenting with symptoms suggestive of acute sinusitis.  Provided azithromycin and counseled on over-the-counter medications.  Has been tested for Covid with negative test in the past week.  Counseled on supportive care.  Given indications on follow-up.  Final Clinical Impressions(s) / UC Diagnoses   Final diagnoses:  Acute non-recurrent frontal sinusitis     Discharge Instructions     Please try things such as zyrtec-D or allegra-D which is an antihistamine and decongestant.  Please try afrin which will help with nasal congestion but use for only three days.  Please also try using a netti pot on a regular occasion. Please try honey, vick's vapor rub, lozenges and humidifer for cough and sore throat . Please follow up if your symptoms fail to improve.      ED Prescriptions    Medication Sig Dispense Auth. Provider   azithromycin (ZITHROMAX) 250 MG tablet Take 1 tablet (250 mg total) by mouth daily. Take first 2 tablets together, then 1 every day until finished. 6 tablet Myra Rude, MD     PDMP not reviewed this encounter.   Myra Rude, MD 04/15/20 1330

## 2020-05-04 ENCOUNTER — Encounter (HOSPITAL_COMMUNITY): Payer: Self-pay

## 2020-05-04 ENCOUNTER — Ambulatory Visit (HOSPITAL_COMMUNITY)
Admission: RE | Admit: 2020-05-04 | Discharge: 2020-05-04 | Disposition: A | Discharge status: Home / Self Care | Payer: Medicaid Other | Source: Ambulatory Visit | Attending: Family Medicine | Admitting: Family Medicine

## 2020-05-04 ENCOUNTER — Other Ambulatory Visit: Payer: Self-pay

## 2020-05-04 ENCOUNTER — Telehealth (HOSPITAL_COMMUNITY): Payer: Self-pay | Admitting: Emergency Medicine

## 2020-05-04 VITALS — BP 111/85 | HR 88 | Temp 99.2°F | Resp 18

## 2020-05-04 DIAGNOSIS — Z202 Contact with and (suspected) exposure to infections with a predominantly sexual mode of transmission: Secondary | ICD-10-CM | POA: Diagnosis not present

## 2020-05-04 LAB — HIV ANTIBODY (ROUTINE TESTING W REFLEX): HIV Screen 4th Generation wRfx: NONREACTIVE

## 2020-05-04 MED ORDER — AZITHROMYCIN 250 MG PO TABS: 1000.0000 mg | ORAL_TABLET | Freq: Once | ORAL | 0 refills | Status: AC

## 2020-05-04 MED ORDER — AZITHROMYCIN 250 MG PO TABS
ORAL_TABLET | ORAL | Status: AC
  Filled 2020-05-04: qty 4

## 2020-05-04 MED ORDER — AZITHROMYCIN 250 MG PO TABS
1000.0000 mg | ORAL_TABLET | Freq: Once | ORAL | Status: AC
  Administered 2020-05-04: 1000 mg via ORAL

## 2020-05-04 NOTE — ED Provider Notes (Signed)
[facility]    CSN: [id number] Arrival date & time: 05/04/20  1326      History   Chief Complaint Chief Complaint  Patient presents with  . Appointment  . Vaginal Discharge  . STD Testing    HPI [name] Alvarez is a 24 y.o. female.   HPI Got a call that her partner is positive for chlamydia Mild vaginal d/c No fever or chills No abdominal pain or rash Wants testing and treatment  History reviewed. No pertinent past medical history.  Patient Active Problem List   Diagnosis Date Noted  . S/P cesarean section 11/13/2014  . Chlamydia infection affecting pregnancy in third trimester, antepartum 09/14/2014  . Insufficient prenatal care in third trimester 09/13/2014  . History of cesarean section complicating pregnancy 09/13/2014  . Sickle cell trait (HCC) 09/11/2014  . Encounter for routine screening for malformation using ultrasonics   . Rubella nonimmune 09/04/2014    Past Surgical History:  Procedure Laterality Date  . CESAREAN SECTION  2013   [place]  . CESAREAN SECTION N/A 11/13/2014   Procedure: CESAREAN SECTION;  Surgeon: Catalina Antigua, MD;  Location: [facility];  Service: Obstetrics;  Laterality: N/A;    OB History    Gravida  2   Para  2   Term  2   Preterm  0   AB  0   Living  2     SAB  0   TAB  0   Ectopic  0   Multiple  0   Live Births  2            Home Medications    Prior to Admission medications   Medication Sig Start Date End Date Taking? Authorizing Provider  acetaminophen (TYLENOL) 325 MG tablet Take 1 tablet (325 mg total) by mouth every 8 (eight) hours as needed for fever. 02/09/15   Sciacca, Marissa, PA-C  guaiFENesin (MUCINEX) 600 MG 12 hr tablet Take 1 tablet (600 mg total) by mouth 2 (two) times daily. 11/08/17   Couture, Cortni S, PA-C  fluticasone (FLONASE) 50 MCG/ACT nasal spray Place 2 sprays into both nostrils daily. 11/08/17 04/15/20  Couture, Cortni S, PA-C    Family History Family History  Problem  Relation Age of Onset  . Hypertension Father     Social History Social History   Tobacco Use  . Smoking status: Never Smoker  . Smokeless tobacco: Never Used  Substance Use Topics  . Alcohol use: No  . Drug use: No     Allergies   Patient has no known allergies.   Review of Systems Review of Systems See HPI  Physical Exam Triage Vital Signs ED Triage Vitals  Enc Vitals Group     BP 05/04/20 1417 111/85     Pulse Rate 05/04/20 1417 88     Resp 05/04/20 1417 18     Temp 05/04/20 1417 99.2 F (37.3 C)     Temp Source 05/04/20 1417 Oral     SpO2 05/04/20 1417 100 %     Weight --      Height --      Head Circumference --      Peak Flow --      Pain Score 05/04/20 1419 0     Pain Loc --      Pain Edu? --      Excl. in GC? --    No data found.  Updated Vital Signs BP 111/85 (BP Location: Right Arm)  Pulse 88   Temp 99.2 F (37.3 C) (Oral)   Resp 18   LMP 04/06/2020   SpO2 100%      Physical Exam Constitutional:      General: She is not in acute distress.    Appearance: She is well-developed.  HENT:     Head: Normocephalic and atraumatic.  Eyes:     Conjunctiva/sclera: Conjunctivae normal.     Pupils: Pupils are equal, round, and reactive to light.  Cardiovascular:     Rate and Rhythm: Normal rate.  Pulmonary:     Effort: Pulmonary effort is normal. No respiratory distress.  Genitourinary:    Comments: Vaginal self swab is described Musculoskeletal:        General: Normal range of motion.     Cervical back: Normal range of motion.  Skin:    General: Skin is warm and dry.  Neurological:     Mental Status: She is alert.  Psychiatric:        Mood and Affect: Mood normal.        Behavior: Behavior normal.      UC Treatments / Results  Labs (all labs ordered are listed, but only abnormal results are displayed) Labs Reviewed  RPR  HIV ANTIBODY (ROUTINE TESTING W REFLEX)  CERVICOVAGINAL ANCILLARY ONLY    EKG   Radiology No results  found.  Procedures Procedures (including critical care time)  Medications Ordered in UC Medications  azithromycin (ZITHROMAX) tablet 1,000 mg (has no administration in time range)    Initial Impression / Assessment and Plan / UC Course  I have reviewed the triage vital signs and the nursing notes.  Pertinent labs & imaging results that were available during my care of the patient were reviewed by me and considered in my medical decision making (see chart for details).     Reviewed the importance of safe sex.  Treated for chlamydia.  Tested for other STDs.  She can get her results through MyChart. Final Clinical Impressions(s) / UC Diagnoses   Final diagnoses:  Exposure to STD     Discharge Instructions     Check for results on My Chart You will be called if any tests are positive    ED Prescriptions    None     PDMP not reviewed this encounter.   Eustace Moore, MD 05/04/20 [phone]

## 2020-05-04 NOTE — Discharge Instructions (Addendum)
Check for results on My Chart You will be called if any tests are positive

## 2020-05-04 NOTE — ED Triage Notes (Signed)
Pt presents for STD testing after stating partner was tested and treated for chylamdia: Pt states she is having some vaginal discharge.

## 2020-05-04 NOTE — Telephone Encounter (Signed)
Patient called and states she threw up her medication after leaving the office.  1G of Azithro sent to pharmacy of request and patient encouraged to eat a meal before going to pick up/take.  Patient verbalized understanding.

## 2020-05-05 ENCOUNTER — Ambulatory Visit (HOSPITAL_COMMUNITY): Payer: Self-pay

## 2020-05-07 LAB — CERVICOVAGINAL ANCILLARY ONLY
Bacterial Vaginitis (gardnerella): POSITIVE — AB
Chlamydia: NEGATIVE
Comment: NEGATIVE
Comment: NEGATIVE
Comment: NEGATIVE
Comment: NORMAL
Neisseria Gonorrhea: NEGATIVE
Trichomonas: NEGATIVE

## 2020-05-07 LAB — RPR: RPR Ser Ql: NONREACTIVE — AB

## 2020-05-08 ENCOUNTER — Ambulatory Visit (HOSPITAL_COMMUNITY)
Admission: RE | Admit: 2020-05-08 | Discharge: 2020-05-08 | Disposition: A | Discharge status: Home / Self Care | Payer: HRSA Program | Source: Ambulatory Visit | Attending: Emergency Medicine | Admitting: Emergency Medicine

## 2020-05-08 ENCOUNTER — Encounter (HOSPITAL_COMMUNITY): Payer: Self-pay

## 2020-05-08 ENCOUNTER — Other Ambulatory Visit: Payer: Self-pay

## 2020-05-08 VITALS — BP 115/84 | HR 58 | Temp 98.9°F | Resp 16 | Ht 62.0 in | Wt 135.0 lb

## 2020-05-08 DIAGNOSIS — N760 Acute vaginitis: Secondary | ICD-10-CM | POA: Insufficient documentation

## 2020-05-08 DIAGNOSIS — Z975 Presence of (intrauterine) contraceptive device: Secondary | ICD-10-CM | POA: Diagnosis not present

## 2020-05-08 DIAGNOSIS — U071 COVID-19: Secondary | ICD-10-CM | POA: Diagnosis not present

## 2020-05-08 DIAGNOSIS — Z79899 Other long term (current) drug therapy: Secondary | ICD-10-CM | POA: Diagnosis not present

## 2020-05-08 DIAGNOSIS — R350 Frequency of micturition: Secondary | ICD-10-CM

## 2020-05-08 DIAGNOSIS — Z3202 Encounter for pregnancy test, result negative: Secondary | ICD-10-CM

## 2020-05-08 DIAGNOSIS — R481 Agnosia: Secondary | ICD-10-CM

## 2020-05-08 DIAGNOSIS — D573 Sickle-cell trait: Secondary | ICD-10-CM | POA: Insufficient documentation

## 2020-05-08 DIAGNOSIS — R0981 Nasal congestion: Secondary | ICD-10-CM

## 2020-05-08 LAB — POCT URINALYSIS DIPSTICK, ED / UC
Bilirubin Urine: NEGATIVE
Glucose, UA: NEGATIVE mg/dL
Ketones, ur: NEGATIVE mg/dL
Leukocytes,Ua: NEGATIVE
Nitrite: NEGATIVE
Protein, ur: NEGATIVE mg/dL
Specific Gravity, Urine: 1.02 (ref 1.005–1.030)
Urobilinogen, UA: 0.2 mg/dL (ref 0.0–1.0)
pH: 6.5 (ref 5.0–8.0)

## 2020-05-08 LAB — POC URINE PREG, ED: Preg Test, Ur: NEGATIVE

## 2020-05-08 MED ORDER — FLUTICASONE PROPIONATE 50 MCG/ACT NA SUSP: 1.0000 | Freq: Every day | NASAL | 0 refills | Status: DC

## 2020-05-08 MED ORDER — METRONIDAZOLE 500 MG PO TABS: 500.0000 mg | ORAL_TABLET | Freq: Two times a day (BID) | ORAL | 0 refills | Status: DC

## 2020-05-08 NOTE — Discharge Instructions (Signed)
Covid test pending, monitor my chart for results Urine without any signs of bacteria in urine, urinary frequency and back pain may be related to bacterial vaginosis-take metronidazole/Flagyl twice daily for 1 week. Flonase nasal spray or may use over-the-counter Mucinex, Sudafed, Zyrtec for congestion  Follow-up if any symptoms not improving or worsening

## 2020-05-08 NOTE — ED Provider Notes (Signed)
[facility]    CSN: [id number] Arrival date & time: 05/08/20  1417      History   Chief Complaint Chief Complaint  Patient presents with  . multiple complaints    HPI [name] Alvarez is a 24 y.o. female presenting today for evaluation of loss of taste/smell and nasal congestion.  Patient reports over the past 2 days she has had decreased smell and taste.  She reports over the past month she has had sinus congestion, but recently finished antibiotics 4 days ago for sinusitis.  She denies any fevers.  Denies significant fatigue.  Has had some backache, but this is been going on for 3 to 4 weeks as well and has had associated urinary frequency and urgency.  Denies dysuria.  Denies vaginal discharge.  Was seen here for STD screening which was negative but did test positive for BV which she did not complete treatment for.  On Nexplanon.  HPI  History reviewed. No pertinent past medical history.  Patient Active Problem List   Diagnosis Date Noted  . S/P cesarean section 11/13/2014  . Chlamydia infection affecting pregnancy in third trimester, antepartum 09/14/2014  . Insufficient prenatal care in third trimester 09/13/2014  . History of cesarean section complicating pregnancy 09/13/2014  . Sickle cell trait (HCC) 09/11/2014  . Encounter for routine screening for malformation using ultrasonics   . Rubella nonimmune 09/04/2014    Past Surgical History:  Procedure Laterality Date  . CESAREAN SECTION  2013   [place]  . CESAREAN SECTION N/A 11/13/2014   Procedure: CESAREAN SECTION;  Surgeon: Catalina Antigua, MD;  Location: [facility];  Service: Obstetrics;  Laterality: N/A;    OB History    Gravida  2   Para  2   Term  2   Preterm  0   AB  0   Living  2     SAB  0   TAB  0   Ectopic  0   Multiple  0   Live Births  2            Home Medications    Prior to Admission medications   Medication Sig Start Date End Date Taking? Authorizing Provider    acetaminophen (TYLENOL) 325 MG tablet Take 1 tablet (325 mg total) by mouth every 8 (eight) hours as needed for fever. 02/09/15   Sciacca, Marissa, PA-C  fluticasone (FLONASE) 50 MCG/ACT nasal spray Place 1-2 sprays into both nostrils daily for 7 days. 05/08/20 05/15/20  Sisto Granillo C, PA-C  guaiFENesin (MUCINEX) 600 MG 12 hr tablet Take 1 tablet (600 mg total) by mouth 2 (two) times daily. 11/08/17   Couture, Cortni S, PA-C  metroNIDAZOLE (FLAGYL) 500 MG tablet Take 1 tablet (500 mg total) by mouth 2 (two) times daily. 05/08/20   Apollo Timothy, Junius Creamer, PA-C    Family History Family History  Problem Relation Age of Onset  . Hypertension Father     Social History Social History   Tobacco Use  . Smoking status: Never Smoker  . Smokeless tobacco: Never Used  Substance Use Topics  . Alcohol use: Yes    Comment: occ  . Drug use: No     Allergies   Patient has no known allergies.   Review of Systems Review of Systems  Constitutional: Negative for activity change, appetite change, chills, fatigue and fever.  HENT: Positive for congestion and rhinorrhea. Negative for ear pain, sinus pressure, sore throat and trouble swallowing.   Eyes: Negative for  discharge and redness.  Respiratory: Negative for cough, chest tightness and shortness of breath.   Cardiovascular: Negative for chest pain.  Gastrointestinal: Negative for abdominal pain, diarrhea, nausea and vomiting.  Genitourinary: Positive for frequency and urgency. Negative for dysuria, flank pain, genital sores, hematuria, menstrual problem, vaginal bleeding, vaginal discharge and vaginal pain.  Musculoskeletal: Negative for back pain and myalgias.  Skin: Negative for rash.  Neurological: Negative for dizziness, light-headedness and headaches.     Physical Exam Triage Vital Signs ED Triage Vitals  Enc Vitals Group     BP      Pulse      Resp      Temp      Temp src      SpO2      Weight      Height      Head  Circumference      Peak Flow      Pain Score      Pain Loc      Pain Edu?      Excl. in GC?    No data found.  Updated Vital Signs BP 115/84   Pulse (!) 58   Temp 98.9 F (37.2 C) (Oral)   Resp 16   Ht 5\' 2"  (1.575 m)   Wt 135 lb (61.2 kg)   SpO2 100%   BMI 24.69 kg/m   Visual Acuity Right Eye Distance:   Left Eye Distance:   Bilateral Distance:    Right Eye Near:   Left Eye Near:    Bilateral Near:     Physical Exam Vitals and nursing note reviewed.  Constitutional:      Appearance: She is well-developed.     Comments: No acute distress  HENT:     Head: Normocephalic and atraumatic.     Ears:     Comments: Bilateral ears without tenderness to palpation of external auricle, tragus and mastoid, EAC's without erythema or swelling, TM's with good bony landmarks and cone of light. Non erythematous.     Nose: Nose normal.     Mouth/Throat:     Comments: Oral mucosa pink and moist, no tonsillar enlargement or exudate. Posterior pharynx patent and nonerythematous, no uvula deviation or swelling. Normal phonation. Eyes:     Conjunctiva/sclera: Conjunctivae normal.  Cardiovascular:     Rate and Rhythm: Normal rate.  Pulmonary:     Effort: Pulmonary effort is normal. No respiratory distress.     Comments: Breathing comfortably at rest, CTABL, no wheezing, rales or other adventitious sounds auscultated Abdominal:     General: There is no distension.  Musculoskeletal:        General: Normal range of motion.     Cervical back: Neck supple.     Comments: Back nontender to palpation  Skin:    General: Skin is warm and dry.  Neurological:     Mental Status: She is alert and oriented to person, place, and time.      UC Treatments / Results  Labs (all labs ordered are listed, but only abnormal results are displayed) Labs Reviewed  POCT URINALYSIS DIPSTICK, ED / UC - Abnormal; Notable for the following components:      Result Value   Hgb urine dipstick TRACE (*)     All other components within normal limits  SARS CORONAVIRUS 2 (TAT 6-24 HRS)  URINE CULTURE  POC URINE PREG, ED    EKG   Radiology No results found.  Procedures Procedures (including critical care time)  Medications Ordered in UC Medications - No data to display  Initial Impression / Assessment and Plan / UC Course  I have reviewed the triage vital signs and the nursing notes.  Pertinent labs & imaging results that were available during my care of the patient were reviewed by me and considered in my medical decision making (see chart for details).     UA with negative leuks and nitrites.  Suspect likely urinary frequency and urgency and back pain possibly related to bacterial vaginosis which she has not completed treatment for.  Sending in Flagyl twice daily x1 week.  Monitor for resolution of symptoms.  Covid test pending for loss of taste and smell, recently completed antibiotics for sinusitis.  Continue supportive care of nasal congestion.  Flonase sent in.  Discussed strict return precautions. Patient verbalized understanding and is agreeable with plan.  Final Clinical Impressions(s) / UC Diagnoses   Final diagnoses:  Nasal congestion  Loss of perception for taste  Urinary frequency     Discharge Instructions     Covid test pending, monitor my chart for results Urine without any signs of bacteria in urine, urinary frequency and back pain may be related to bacterial vaginosis-take metronidazole/Flagyl twice daily for 1 week. Flonase nasal spray or may use over-the-counter Mucinex, Sudafed, Zyrtec for congestion  Follow-up if any symptoms not improving or worsening    ED Prescriptions    Medication Sig Dispense Auth. Provider   metroNIDAZOLE (FLAGYL) 500 MG tablet Take 1 tablet (500 mg total) by mouth 2 (two) times daily. 14 tablet Dilyn Osoria C, PA-C   fluticasone (FLONASE) 50 MCG/ACT nasal spray Place 1-2 sprays into both nostrils daily for 7 days. 1 g  Dally Oshel, [place] C, PA-C     PDMP not reviewed this encounter.   Lew Dawes, PA-C 05/08/20 1711

## 2020-05-08 NOTE — ED Triage Notes (Signed)
Pt c/o loss of smell and taste 2 days ago. Pt c/o 6/10 sharp pain in lower backx1 wk. Pt c/o frequent urination, urgencyx1 mo.

## 2020-05-09 ENCOUNTER — Telehealth: Payer: Self-pay | Admitting: Emergency Medicine

## 2020-05-09 LAB — SARS CORONAVIRUS 2 (TAT 6-24 HRS): SARS Coronavirus 2: POSITIVE — AB

## 2020-05-09 NOTE — Telephone Encounter (Signed)
Your test for COVID-19 was positive, meaning that you were infected with the novel coronavirus and could give the germ to others.  Please continue isolation at home for at least 10 days since the start of your symptoms. If you do not have symptoms, please isolate at home for 10 days from the day you were tested. Once you complete your 10 day quarantine, you may return to normal activities as long as you've not had a fever for over 24 hours(without taking fever reducing medicine) and your symptoms are improving. Please continue good preventive care measures, including:  frequent hand-washing, avoid touching your face, cover coughs/sneezes, stay out of crowds and keep a 6 foot distance from others.  Go to the nearest hospital emergency room if fever/cough/breathlessness are severe or illness seems like a threat to life.  Attempted to call no answer LVMM

## 2020-05-11 LAB — URINE CULTURE: Culture: 50000 — AB

## 2020-05-11 MED ORDER — NITROFURANTOIN MONOHYD MACRO 100 MG PO CAPS: 100.0000 mg | ORAL_CAPSULE | Freq: Two times a day (BID) | ORAL | 0 refills | Status: DC

## 2020-05-15 ENCOUNTER — Ambulatory Visit: Payer: Self-pay

## 2020-06-16 ENCOUNTER — Ambulatory Visit
Admission: RE | Admit: 2020-06-16 | Discharge: 2020-06-16 | Disposition: A | Discharge status: Home / Self Care | Payer: Medicaid Other | Source: Ambulatory Visit | Attending: Family Medicine | Admitting: Family Medicine

## 2020-06-16 ENCOUNTER — Other Ambulatory Visit: Payer: Self-pay

## 2020-06-16 VITALS — BP 118/82 | HR 74 | Temp 99.0°F | Resp 16

## 2020-06-16 DIAGNOSIS — N760 Acute vaginitis: Secondary | ICD-10-CM | POA: Insufficient documentation

## 2020-06-16 LAB — POCT URINE PREGNANCY: Preg Test, Ur: NEGATIVE

## 2020-06-16 MED ORDER — FLUCONAZOLE 150 MG PO TABS
150.0000 mg | ORAL_TABLET | Freq: Once | ORAL | 0 refills | Status: AC
Start: 2020-06-16 — End: 2020-06-16

## 2020-06-16 NOTE — ED Triage Notes (Signed)
Pt here for STD screening thinks she has a yeast infection

## 2020-06-16 NOTE — ED Provider Notes (Signed)
[facility]    CSN: [id number] Arrival date & time: 06/16/20  1427      History   Chief Complaint No chief complaint on file.   HPI [name] Alvarez is a 24 y.o. female.   HPI  Patient presents for STD testing. She endorses a history of recurrent yeast infections and bacterial vaginosis. She endorses changes in vaginal discharge with irritation.  No known specific exposure to any specific STDs. Endorses sexual intercourse without barrier protection. No past medical history on file.  Patient Active Problem List   Diagnosis Date Noted  . S/P cesarean section 11/13/2014  . Chlamydia infection affecting pregnancy in third trimester, antepartum 09/14/2014  . Insufficient prenatal care in third trimester 09/13/2014  . History of cesarean section complicating pregnancy 09/13/2014  . Sickle cell trait (HCC) 09/11/2014  . Encounter for routine screening for malformation using ultrasonics   . Rubella nonimmune 09/04/2014    Past Surgical History:  Procedure Laterality Date  . CESAREAN SECTION  2013   [place]  . CESAREAN SECTION N/A 11/13/2014   Procedure: CESAREAN SECTION;  Surgeon: Catalina Antigua, MD;  Location: [facility];  Service: Obstetrics;  Laterality: N/A;    OB History    Gravida  2   Para  2   Term  2   Preterm  0   AB  0   Living  2     SAB  0   TAB  0   Ectopic  0   Multiple  0   Live Births  2            Home Medications    Prior to Admission medications   Medication Sig Start Date End Date Taking? Authorizing Provider  acetaminophen (TYLENOL) 325 MG tablet Take 1 tablet (325 mg total) by mouth every 8 (eight) hours as needed for fever. 02/09/15   Sciacca, Marissa, PA-C  fluticasone (FLONASE) 50 MCG/ACT nasal spray Place 1-2 sprays into both nostrils daily for 7 days. 05/08/20 05/15/20  Wieters, Hallie C, PA-C  guaiFENesin (MUCINEX) 600 MG 12 hr tablet Take 1 tablet (600 mg total) by mouth 2 (two) times daily. 11/08/17   Couture, Cortni S,  PA-C  metroNIDAZOLE (FLAGYL) 500 MG tablet Take 1 tablet (500 mg total) by mouth 2 (two) times daily. 05/08/20   Wieters, Hallie C, PA-C  nitrofurantoin, macrocrystal-monohydrate, (MACROBID) 100 MG capsule Take 1 capsule (100 mg total) by mouth 2 (two) times daily. 05/11/20   LampteyBritta Mccreedy, MD    Family History Family History  Problem Relation Age of Onset  . Hypertension Father     Social History Social History   Tobacco Use  . Smoking status: Never Smoker  . Smokeless tobacco: Never Used  Substance Use Topics  . Alcohol use: Yes    Comment: occ  . Drug use: No     Allergies   Patient has no known allergies. Review of Systems Review of Systems Pertinent negatives listed in HPI Physical Exam Triage Vital Signs ED Triage Vitals [06/16/20 1450]  Enc Vitals Group     BP 118/82     Pulse Rate 74     Resp 16     Temp 99 F (37.2 C)     Temp Source Oral     SpO2 100 %     Weight      Height      Head Circumference      Peak Flow      Pain Score  Pain Loc      Pain Edu?      Excl. in GC?    No data found.  Updated Vital Signs BP 118/82 (BP Location: Left Arm)   Pulse 74   Temp 99 F (37.2 C) (Oral)   Resp 16   SpO2 100%   Visual Acuity Right Eye Distance:   Left Eye Distance:   Bilateral Distance:    Right Eye Near:   Left Eye Near:    Bilateral Near:     Physical Exam General appearance: alert, well developed, well nourished, cooperative and in no distress Head: Normocephalic, without obvious abnormality, atraumatic Respiratory: Respirations even and unlabored, normal respiratory rate Heart: rate and rhythm normal.   Extremities: No gross deformities Skin: Skin color, texture, turgor normal. No rashes seen  Psych: Appropriate mood and affect.  Vaginal Cytology self collected    UC Treatments / Results  Labs (all labs ordered are listed, but only abnormal results are displayed) Labs Reviewed - No data to  display  EKG   Radiology No results found.  Procedures Procedures (including critical care time)  Medications Ordered in UC Medications - No data to display  Initial Impression / Assessment and Plan / UC Course  I have reviewed the triage vital signs and the nursing notes.  Pertinent labs & imaging results that were available during my care of the patient were reviewed by me and considered in my medical decision making (see chart for details).    Given itching and thicken vaginal discharge will treat as a infection  Fungal/yeast. Diflucan 150 mg once may repeat in 3 days. Urine HCG negative. Vaginal cytology pending. Final Clinical Impressions. s) / UC Diagnoses   Final diagnoses:  Acute vaginitis     Discharge Instructions     We will notify you once your vaginal cytology has resulted via MyChart and prescribed any treatment needed per results of lab.    ED Prescriptions    Medication Sig Dispense Auth. Provider   fluconazole (DIFLUCAN) 150 MG tablet Take 1 tablet (150 mg total) by mouth once for 1 dose. Repeat if needed 2 tablet Bing Neighbors, FNP     PDMP not reviewed this encounter.   Bing Neighbors, FNP 06/20/20 2324

## 2020-06-16 NOTE — Discharge Instructions (Addendum)
We will notify you once your vaginal cytology has resulted via MyChart and prescribed any treatment needed per results of lab.

## 2020-06-20 ENCOUNTER — Telehealth (HOSPITAL_COMMUNITY): Payer: Self-pay | Admitting: Emergency Medicine

## 2020-06-20 LAB — CERVICOVAGINAL ANCILLARY ONLY
Bacterial Vaginitis (gardnerella): POSITIVE — AB
Candida Glabrata: NEGATIVE
Candida Vaginitis: NEGATIVE
Chlamydia: NEGATIVE
Comment: NEGATIVE
Comment: NEGATIVE
Comment: NEGATIVE
Comment: NEGATIVE
Comment: NEGATIVE
Comment: NORMAL
Neisseria Gonorrhea: NEGATIVE
Trichomonas: NEGATIVE

## 2020-06-20 MED ORDER — METRONIDAZOLE 500 MG PO TABS: 500.0000 mg | ORAL_TABLET | Freq: Two times a day (BID) | ORAL | 0 refills | Status: DC

## 2020-07-17 ENCOUNTER — Ambulatory Visit (HOSPITAL_COMMUNITY)
Admission: EM | Admit: 2020-07-17 | Discharge: 2020-07-17 | Disposition: A | Discharge status: Home / Self Care | Payer: Medicaid Other

## 2020-07-18 ENCOUNTER — Ambulatory Visit (INDEPENDENT_AMBULATORY_CARE_PROVIDER_SITE_OTHER): Payer: Medicaid Other | Admitting: Advanced Practice Midwife

## 2020-07-18 ENCOUNTER — Encounter: Payer: Self-pay | Admitting: Advanced Practice Midwife

## 2020-07-18 ENCOUNTER — Other Ambulatory Visit: Payer: Self-pay

## 2020-07-18 ENCOUNTER — Other Ambulatory Visit (HOSPITAL_COMMUNITY)
Admission: RE | Admit: 2020-07-18 | Discharge: 2020-07-18 | Disposition: A | Discharge status: Home / Self Care | Payer: Medicaid Other | Source: Ambulatory Visit | Attending: Advanced Practice Midwife | Admitting: Advanced Practice Midwife

## 2020-07-18 VITALS — BP 135/83 | HR 83 | Ht 62.0 in | Wt 143.5 lb

## 2020-07-18 DIAGNOSIS — T8332XA Displacement of intrauterine contraceptive device, initial encounter: Secondary | ICD-10-CM | POA: Diagnosis not present

## 2020-07-18 DIAGNOSIS — Z01419 Encounter for gynecological examination (general) (routine) without abnormal findings: Secondary | ICD-10-CM | POA: Insufficient documentation

## 2020-07-18 DIAGNOSIS — Z975 Presence of (intrauterine) contraceptive device: Secondary | ICD-10-CM | POA: Diagnosis not present

## 2020-07-18 DIAGNOSIS — Z538 Procedure and treatment not carried out for other reasons: Secondary | ICD-10-CM

## 2020-07-18 DIAGNOSIS — Z23 Encounter for immunization: Secondary | ICD-10-CM

## 2020-07-18 NOTE — Progress Notes (Signed)
GYNECOLOGY ANNUAL PREVENTATIVE CARE ENCOUNTER NOTE  Subjective:   [name] Alvarez is a 24 y.o. [id number] female here for a routine annual gynecologic exam.  Current complaints: none.   Denies abnormal vaginal bleeding, discharge, pelvic pain, problems with intercourse or other gynecologic concerns.    Patient would like to have IUD removed today. She has had IUD in place since 2016. She wants to wait 3 months before resuming birth control and then may consider non-hormonal IUD when she is ready.   Gynecologic History No LMP recorded. Contraception: IUD currently, but would like it removed  Last Pap: never. Results were: NA Last mammogram: NA, age . Results were: NA, age   Obstetric History OB History  Gravida Para Term Preterm AB Living  2 2 2  0 0 2  SAB TAB Ectopic Multiple Live Births  0 0 0 0 2    # Outcome Date GA Lbr Len/2nd Weight Sex Delivery Anes PTL Lv  2 Term 11/13/14 [redacted]w[redacted]d  6 lb 13 oz (3.09 kg) F CS-LTranv   LIV  1 Term 02/15/12    F CS-Unspec   LIV    No past medical history on file.  Past Surgical History:  Procedure Laterality Date  . CESAREAN SECTION  2013   2014  . CESAREAN SECTION N/A 11/13/2014   Procedure: CESAREAN SECTION;  Surgeon: 11/15/2014, MD;  Location: [facility];  Service: Obstetrics;  Laterality: N/A;    Current Outpatient Medications on File Prior to Visit  Medication Sig Dispense Refill  . acetaminophen (TYLENOL) 325 MG tablet Take 1 tablet (325 mg total) by mouth every 8 (eight) hours as needed for fever. (Patient not taking: Reported on 07/18/2020) 11 tablet 0  . fluticasone (FLONASE) 50 MCG/ACT nasal spray Place 1-2 sprays into both nostrils daily for 7 days. 1 g 0  . guaiFENesin (MUCINEX) 600 MG 12 hr tablet Take 1 tablet (600 mg total) by mouth 2 (two) times daily. (Patient not taking: Reported on 06/16/2020) 14 tablet 0  . metroNIDAZOLE (FLAGYL) 500 MG tablet Take 1 tablet (500 mg total) by mouth 2 (two) times daily. (Patient not taking: Reported on  07/18/2020) 14 tablet 0  . nitrofurantoin, macrocrystal-monohydrate, (MACROBID) 100 MG capsule Take 1 capsule (100 mg total) by mouth 2 (two) times daily. (Patient not taking: Reported on 06/16/2020) 10 capsule 0   No current facility-administered medications on file prior to visit.    No Known Allergies  Social History   Socioeconomic History  . Marital status: Single    Spouse name: Not on file  . Number of children: Not on file  . Years of education: Not on file  . Highest education level: Not on file  Occupational History  . Not on file  Tobacco Use  . Smoking status: Never Smoker  . Smokeless tobacco: Never Used  Substance and Sexual Activity  . Alcohol use: Yes    Comment: occ  . Drug use: No  . Sexual activity: Not on file    Comment: pregnant  Other Topics Concern  . Not on file  Social History Narrative  . Not on file   Social Determinants of Health   Financial Resource Strain:   . Difficulty of Paying Living Expenses: Not on file  Food Insecurity:   . Worried About 08/16/2020 in the Last Year: Not on file  . Ran Out of Food in the Last Year: Not on file  Transportation Needs:   . Lack of Transportation (Medical): Not  on file  . Lack of Transportation (Non-Medical): Not on file  Physical Activity:   . Days of Exercise per Week: Not on file  . Minutes of Exercise per Session: Not on file  Stress:   . Feeling of Stress : Not on file  Social Connections:   . Frequency of Communication with Friends and Family: Not on file  . Frequency of Social Gatherings with Friends and Family: Not on file  . Attends Religious Services: Not on file  . Active Member of Clubs or Organizations: Not on file  . Attends Banker Meetings: Not on file  . Marital Status: Not on file  Intimate Partner Violence:   . Fear of Current or Ex-Partner: Not on file  . Emotionally Abused: Not on file  . Physically Abused: Not on file  . Sexually Abused: Not on file     Family History  Problem Relation Age of Onset  . Hypertension Father     The following portions of the patient's history were reviewed and updated as appropriate: allergies, current medications, past family history, past medical history, past social history, past surgical history and problem list.  Review of Systems Pertinent items noted in HPI and remainder of comprehensive ROS otherwise negative.   Objective:  BP 135/83   Pulse 83   Ht 5\' 2"  (1.575 m)   Wt 143 lb 8 oz (65.1 kg)   BMI 26.25 kg/m  CONSTITUTIONAL: Well-developed, well-nourished female in no acute distress.  HENT:  Normocephalic, atraumatic, External right and left ear normal. Oropharynx is clear and moist EYES: Conjunctivae and EOM are normal. Pupils are equal, round, and reactive to light. No scleral icterus.  NECK: Normal range of motion, supple, no masses.  Normal thyroid.  SKIN: Skin is warm and dry. No rash noted. Not diaphoretic. No erythema. No pallor. NEUROLOGIC: Alert and oriented to person, place, and time. Normal reflexes, muscle tone coordination. No cranial nerve deficit noted. PSYCHIATRIC: Normal mood and affect. Normal behavior. Normal judgment and thought content. CARDIOVASCULAR: Normal heart rate noted, regular rhythm RESPIRATORY: Clear to auscultation bilaterally. Effort and breath sounds normal, no problems with respiration noted. ABDOMEN: Soft, normal bowel sounds, no distention noted.  No tenderness, rebound or guarding.  PELVIC: Normal appearing external genitalia; normal appearing vaginal mucosa and cervix.  No abnormal discharge noted.  Pap smear obtained.  Normal uterine size, no other palpable masses, no uterine or adnexal tenderness. MUSCULOSKELETAL: Normal range of motion. No tenderness.  No cyanosis, clubbing, or edema.  2+ distal pulses.  GYNECOLOGY OFFICE PROCEDURE NOTE  IUD Removal  Patient identified, informed consent performed, consent signed.  Patient was in the dorsal  lithotomy position, normal external genitalia was noted.  A speculum was placed in the patient's vagina, normal discharge was noted, no lesions. The cervix was visualized, no lesions, no abnormal discharge.  Unable to visualize the IUD strings. Attempted to sweep the endocervical canal with cytobroom. Strings still not visible. Kelly forceps were introduced into the endocervical cavity and unable to grasp the IUD. Patient states that she has never felt strings the entire time the IUD has been in place. Will schedule for to localize the IUD and then with a MD provider to attempt removal. Patient tolerated the procedure well.     Assessment and Plan:  1. Intrauterine contraceptive device threads lost, initial encounter - US PELVIC COMPLETE WITH TRANSVAGINAL; Future  2. Women's annual routine gynecological examination - HPV vaccine quadravalent 3 dose IM - Pap Smear  today   3. Unsuccessful attempt to remove intrauterine device (IUD) - Will get [place] and then have patient FU with MD provider for removal of IUD   Will follow up results of pap smear and manage accordingly. Pelvic US scheduled  Routine preventative health maintenance measures emphasized. Please refer to After Visit Summary for other counseling recommendations.  FU after [place] with MD for IUD removal.    Thressa Sheller DNP, CNM  07/18/20  2:43 PM

## 2020-07-19 LAB — CYTOLOGY - PAP
Chlamydia: NEGATIVE
Comment: NEGATIVE
Comment: NEGATIVE
Comment: NORMAL
Diagnosis: NEGATIVE
High risk HPV: NEGATIVE
Neisseria Gonorrhea: NEGATIVE

## 2020-07-24 ENCOUNTER — Encounter: Payer: Self-pay | Admitting: General Practice

## 2020-08-01 ENCOUNTER — Ambulatory Visit
Admission: RE | Admit: 2020-08-01 | Discharge: 2020-08-01 | Disposition: A | Discharge status: Home / Self Care | Payer: Medicaid Other | Source: Ambulatory Visit | Attending: Advanced Practice Midwife | Admitting: Advanced Practice Midwife

## 2020-08-01 ENCOUNTER — Other Ambulatory Visit: Payer: Self-pay

## 2020-08-01 DIAGNOSIS — T8332XA Displacement of intrauterine contraceptive device, initial encounter: Secondary | ICD-10-CM | POA: Insufficient documentation

## 2020-08-02 ENCOUNTER — Telehealth: Payer: Self-pay | Admitting: Lactation Services

## 2020-08-02 NOTE — Telephone Encounter (Signed)
Called and spoke with patient to let her know that her IUD is in place. Patient confirmed she would like it removed. Message to front office to make appt with MD for IUD removal.

## 2020-08-02 NOTE — Telephone Encounter (Signed)
-----   Message from Armando Reichert, CNM sent at 08/02/2020 11:35 AM EST ----- Patient's IUD is in place. Can someone call her to let her know, and also make sure she gets scheduled with a MD to attempt to remove it for her.

## 2020-08-06 ENCOUNTER — Ambulatory Visit (HOSPITAL_COMMUNITY)
Admission: EM | Admit: 2020-08-06 | Discharge: 2020-08-06 | Disposition: A | Discharge status: Home / Self Care | Payer: Medicaid Other | Attending: Family Medicine | Admitting: Family Medicine

## 2020-08-06 ENCOUNTER — Other Ambulatory Visit: Payer: Self-pay

## 2020-08-06 ENCOUNTER — Encounter (HOSPITAL_COMMUNITY): Payer: Self-pay

## 2020-08-06 DIAGNOSIS — N760 Acute vaginitis: Secondary | ICD-10-CM | POA: Insufficient documentation

## 2020-08-06 NOTE — ED Provider Notes (Signed)
[facility]    CSN: [id number] Arrival date & time: 08/06/20  1735      History   Chief Complaint Chief Complaint  Patient presents with  . Vaginitis    HPI [name] Alvarez is a 24 y.o. female.   Here today with vaginal discharge, itching, irritation x 1 day. Denies pelvic pain, dysuria, hematuria, N/V/D, fever, rashes. Unsure if exposed to any STIs, wanting screening swab. Not trying anything OTC for sxs.      History reviewed. No pertinent past medical history.  Patient Active Problem List   Diagnosis Date Noted  . S/P cesarean section 11/13/2014  . Chlamydia infection affecting pregnancy in third trimester, antepartum 09/14/2014  . Insufficient prenatal care in third trimester 09/13/2014  . History of cesarean section complicating pregnancy 09/13/2014  . Sickle cell trait (HCC) 09/11/2014  . Encounter for routine screening for malformation using ultrasonics   . Rubella nonimmune 09/04/2014    Past Surgical History:  Procedure Laterality Date  . CESAREAN SECTION  2013   [place]  . CESAREAN SECTION N/A 11/13/2014   Procedure: CESAREAN SECTION;  Surgeon: Catalina Antigua, MD;  Location: [facility];  Service: Obstetrics;  Laterality: N/A;    OB History    Gravida  2   Para  2   Term  2   Preterm  0   AB  0   Living  2     SAB  0   TAB  0   Ectopic  0   Multiple  0   Live Births  2            Home Medications    Prior to Admission medications   Medication Sig Start Date End Date Taking? Authorizing Provider  acetaminophen (TYLENOL) 325 MG tablet Take 1 tablet (325 mg total) by mouth every 8 (eight) hours as needed for fever. Patient not taking: Reported on 07/18/2020 02/09/15   Sciacca, Marissa, PA-C  fluticasone (FLONASE) 50 MCG/ACT nasal spray Place 1-2 sprays into both nostrils daily for 7 days. 05/08/20 05/15/20  Wieters, Hallie C, PA-C  guaiFENesin (MUCINEX) 600 MG 12 hr tablet Take 1 tablet (600 mg total) by mouth 2 (two) times  daily. Patient not taking: Reported on 06/16/2020 11/08/17   Couture, Cortni S, PA-C  metroNIDAZOLE (FLAGYL) 500 MG tablet Take 1 tablet (500 mg total) by mouth 2 (two) times daily. Patient not taking: Reported on 07/18/2020 06/20/20   Merrilee Jansky, MD  nitrofurantoin, macrocrystal-monohydrate, (MACROBID) 100 MG capsule Take 1 capsule (100 mg total) by mouth 2 (two) times daily. Patient not taking: Reported on 06/16/2020 05/11/20   Merrilee Jansky, MD    Family History Family History  Problem Relation Age of Onset  . Hypertension Father     Social History Social History   Tobacco Use  . Smoking status: Never Smoker  . Smokeless tobacco: Never Used  Substance Use Topics  . Alcohol use: Yes    Comment: occ  . Drug use: No     Allergies   Patient has no known allergies.   Review of Systems Review of Systems PER HPI   Physical Exam Triage Vital Signs ED Triage Vitals  Enc Vitals Group     BP 08/06/20 1904 128/90     Pulse Rate 08/06/20 1904 76     Resp 08/06/20 1904 18     Temp 08/06/20 1904 98.8 F (37.1 C)     Temp Source 08/06/20 1904 Oral  SpO2 08/06/20 1904 100 %     Weight --      Height --      Head Circumference --      Peak Flow --      Pain Score 08/06/20 1903 5     Pain Loc --      Pain Edu? --      Excl. in GC? --    No data found.  Updated Vital Signs BP 128/90 (BP Location: Right Arm)   Pulse 76   Temp 98.8 F (37.1 C) (Oral)   Resp 18   LMP 07/01/2020   SpO2 100%   Visual Acuity Right Eye Distance:   Left Eye Distance:   Bilateral Distance:    Right Eye Near:   Left Eye Near:    Bilateral Near:     Physical Exam Vitals and nursing note reviewed.  Constitutional:      Appearance: Normal appearance. She is not ill-appearing.  HENT:     Head: Atraumatic.     Mouth/Throat:     Mouth: Mucous membranes are moist.  Eyes:     Extraocular Movements: Extraocular movements intact.     Conjunctiva/sclera: Conjunctivae normal.   Cardiovascular:     Rate and Rhythm: Normal rate and regular rhythm.     Heart sounds: Normal heart sounds.  Pulmonary:     Effort: Pulmonary effort is normal.     Breath sounds: Normal breath sounds.  Abdominal:     General: Bowel sounds are normal. There is no distension.     Palpations: Abdomen is soft.     Tenderness: There is no abdominal tenderness. There is no guarding.  Genitourinary:    Comments: GU exam deferred, self swab performed Musculoskeletal:        General: Normal range of motion.     Cervical back: Normal range of motion and neck supple.  Skin:    General: Skin is warm and dry.  Neurological:     Mental Status: She is alert and oriented to person, place, and time.  Psychiatric:        Mood and Affect: Mood normal.        Thought Content: Thought content normal.        Judgment: Judgment normal.      UC Treatments / Results  Labs (all labs ordered are listed, but only abnormal results are displayed) Labs Reviewed  CERVICOVAGINAL ANCILLARY ONLY    EKG   Radiology No results found.  Procedures Procedures (including critical care time)  Medications Ordered in UC Medications - No data to display  Initial Impression / Assessment and Plan / UC Course  I have reviewed the triage vital signs and the nursing notes.  Pertinent labs & imaging results that were available during my care of the patient were reviewed by me and considered in my medical decision making (see chart for details).     Aptima swab pending, will treat based on results. Vaginal hygiene and safe sexual practices reviewed.   Final Clinical Impressions(s) / UC Diagnoses   Final diagnoses:  Acute vaginitis   Discharge Instructions   None    ED Prescriptions    None     PDMP not reviewed this encounter.   Particia Nearing, [place] 08/06/20 1954

## 2020-08-06 NOTE — ED Triage Notes (Signed)
Pt presents with vaginal itching and burning since yesterday.  

## 2020-08-07 LAB — CERVICOVAGINAL ANCILLARY ONLY
Bacterial Vaginitis (gardnerella): POSITIVE — AB
Candida Glabrata: NEGATIVE
Candida Vaginitis: POSITIVE — AB
Chlamydia: NEGATIVE
Comment: NEGATIVE
Comment: NEGATIVE
Comment: NEGATIVE
Comment: NEGATIVE
Comment: NEGATIVE
Comment: NORMAL
Neisseria Gonorrhea: NEGATIVE
Trichomonas: NEGATIVE

## 2020-08-08 ENCOUNTER — Telehealth (HOSPITAL_COMMUNITY): Payer: Self-pay | Admitting: Emergency Medicine

## 2020-08-08 MED ORDER — METRONIDAZOLE 500 MG PO TABS
500.0000 mg | ORAL_TABLET | Freq: Two times a day (BID) | ORAL | 0 refills | Status: DC
Start: 2020-08-08 — End: 2021-01-14

## 2020-08-08 MED ORDER — FLUCONAZOLE 150 MG PO TABS: 150.0000 mg | ORAL_TABLET | Freq: Once | ORAL | 0 refills | Status: AC

## 2020-08-23 ENCOUNTER — Encounter: Payer: Self-pay | Admitting: Obstetrics & Gynecology

## 2020-08-23 ENCOUNTER — Other Ambulatory Visit: Payer: Self-pay

## 2020-08-23 ENCOUNTER — Ambulatory Visit (INDEPENDENT_AMBULATORY_CARE_PROVIDER_SITE_OTHER): Payer: Medicaid Other | Admitting: Obstetrics & Gynecology

## 2020-08-23 VITALS — BP 112/71 | HR 89 | Wt 141.4 lb

## 2020-08-23 DIAGNOSIS — Z30432 Encounter for removal of intrauterine contraceptive device: Secondary | ICD-10-CM

## 2020-08-23 MED ORDER — NORETHIN-ETH ESTRAD-FE BIPHAS 1 MG-10 MCG / 10 MCG PO TABS: 1.0000 | ORAL_TABLET | Freq: Every day | ORAL | 11 refills | Status: DC

## 2020-08-23 NOTE — Progress Notes (Signed)
    GYNECOLOGY OFFICE PROCEDURE NOTE  [name] Alvarez is a 24 y.o. [id number] here for Liletta IUD removal. No GYN concerns.  Last pap smear was on 07/18/20 and was normal.  Pt has had hair loss and is concerned it it's due to IUD. Pt has prior visit w/ U/S confirming IUD placement since strings were not visualized.    IUD Removal  Patient identified, informed consent performed, consent signed.  Patient was in the dorsal lithotomy position, normal external genitalia was noted.  A speculum was placed in the patient's vagina, normal discharge was noted, no lesions. The cervix was visualized, no lesions, no abnormal discharge.   (The strings of the IUD were not visualized, so Kelly forceps were introduced into the endometrial cavity and the IUD was grasped and removed in its entirety).  Patient tolerated the procedure well.    Patient will use OCPs for contraception.  Routine preventative health maintenance measures emphasized.   Raynelle Dick, MD, FACOG Obstetrician & Gynecologist, [facility] for [employer], [facility]

## 2020-09-15 ENCOUNTER — Ambulatory Visit (HOSPITAL_COMMUNITY): Payer: Self-pay

## 2020-09-17 ENCOUNTER — Ambulatory Visit: Payer: Medicaid Other

## 2020-12-17 ENCOUNTER — Encounter: Payer: Self-pay | Admitting: General Practice

## 2021-01-10 ENCOUNTER — Ambulatory Visit (INDEPENDENT_AMBULATORY_CARE_PROVIDER_SITE_OTHER): Payer: Self-pay

## 2021-01-10 ENCOUNTER — Other Ambulatory Visit: Payer: Self-pay

## 2021-01-10 ENCOUNTER — Other Ambulatory Visit (HOSPITAL_COMMUNITY)
Admission: RE | Admit: 2021-01-10 | Discharge: 2021-01-10 | Disposition: A | Discharge status: Home / Self Care | Payer: Medicaid Other | Source: Ambulatory Visit | Attending: Family Medicine | Admitting: Family Medicine

## 2021-01-10 VITALS — BP 90/62 | HR 84 | Wt 154.3 lb

## 2021-01-10 DIAGNOSIS — N898 Other specified noninflammatory disorders of vagina: Secondary | ICD-10-CM | POA: Insufficient documentation

## 2021-01-10 DIAGNOSIS — N760 Acute vaginitis: Secondary | ICD-10-CM

## 2021-01-10 DIAGNOSIS — N390 Urinary tract infection, site not specified: Secondary | ICD-10-CM

## 2021-01-10 DIAGNOSIS — B9689 Other specified bacterial agents as the cause of diseases classified elsewhere: Secondary | ICD-10-CM

## 2021-01-10 LAB — POCT URINALYSIS DIP (DEVICE)
Bilirubin Urine: NEGATIVE
Glucose, UA: NEGATIVE mg/dL
Hgb urine dipstick: NEGATIVE
Ketones, ur: NEGATIVE mg/dL
Leukocytes,Ua: NEGATIVE
Nitrite: NEGATIVE
Protein, ur: NEGATIVE mg/dL
Specific Gravity, Urine: 1.025 (ref 1.005–1.030)
Urobilinogen, UA: 0.2 mg/dL (ref 0.0–1.0)
pH: 5.5 (ref 5.0–8.0)

## 2021-01-11 LAB — CERVICOVAGINAL ANCILLARY ONLY
Bacterial Vaginitis (gardnerella): POSITIVE — AB
Candida Glabrata: NEGATIVE
Candida Vaginitis: NEGATIVE
Chlamydia: NEGATIVE
Comment: NEGATIVE
Comment: NEGATIVE
Comment: NEGATIVE
Comment: NEGATIVE
Comment: NEGATIVE
Comment: NORMAL
Neisseria Gonorrhea: NEGATIVE
Trichomonas: NEGATIVE

## 2021-01-12 NOTE — Progress Notes (Signed)
Agree with A & P. 

## 2021-01-12 NOTE — Progress Notes (Signed)
Pt here today with c/o lower back pain and feels that she may have a UTI.  Pt reports some urinary frequency and denies burning with urination.  Pt also requests a STD screening.  Pt explained how to obtain self swab.  Pt informed that we will call with abnormal results.  UA resulted negative.  Pt verbalized understanding with no further questions.   Leonette Nutting

## 2021-01-14 MED ORDER — METRONIDAZOLE 500 MG PO TABS: 500.0000 mg | ORAL_TABLET | Freq: Two times a day (BID) | ORAL | 0 refills | Status: DC

## 2021-01-14 NOTE — Addendum Note (Signed)
Addended by: Kathee Delton on: 01/14/2021 01:47 PM   Modules accepted: Orders

## 2021-01-16 ENCOUNTER — Telehealth: Payer: Self-pay | Admitting: Lactation Services

## 2021-01-16 NOTE — Telephone Encounter (Signed)
Called patient to inform her of Vaginal swab results positive for BV and that ATB sent to her pharmacy. Advised to take with food and to finish prescription. Advised no alcohol while taking as can make one ill. Patient voiced understanding.

## 2021-01-30 ENCOUNTER — Ambulatory Visit (INDEPENDENT_AMBULATORY_CARE_PROVIDER_SITE_OTHER): Payer: Self-pay | Admitting: *Deleted

## 2021-01-30 ENCOUNTER — Other Ambulatory Visit (HOSPITAL_COMMUNITY)
Admission: RE | Admit: 2021-01-30 | Discharge: 2021-01-30 | Disposition: A | Discharge status: Home / Self Care | Payer: Medicaid Other | Source: Ambulatory Visit | Attending: Family Medicine | Admitting: Family Medicine

## 2021-01-30 ENCOUNTER — Other Ambulatory Visit: Payer: Self-pay

## 2021-01-30 VITALS — BP 115/71 | HR 74 | Ht 62.0 in | Wt 153.6 lb

## 2021-01-30 DIAGNOSIS — B373 Candidiasis of vulva and vagina: Secondary | ICD-10-CM | POA: Diagnosis not present

## 2021-01-30 DIAGNOSIS — N898 Other specified noninflammatory disorders of vagina: Secondary | ICD-10-CM

## 2021-01-30 DIAGNOSIS — N949 Unspecified condition associated with female genital organs and menstrual cycle: Secondary | ICD-10-CM | POA: Insufficient documentation

## 2021-01-30 DIAGNOSIS — N760 Acute vaginitis: Secondary | ICD-10-CM | POA: Insufficient documentation

## 2021-01-30 DIAGNOSIS — Z113 Encounter for screening for infections with a predominantly sexual mode of transmission: Secondary | ICD-10-CM | POA: Insufficient documentation

## 2021-01-30 DIAGNOSIS — N9489 Other specified conditions associated with female genital organs and menstrual cycle: Secondary | ICD-10-CM

## 2021-01-30 DIAGNOSIS — B9689 Other specified bacterial agents as the cause of diseases classified elsewhere: Secondary | ICD-10-CM | POA: Insufficient documentation

## 2021-01-30 DIAGNOSIS — R300 Dysuria: Secondary | ICD-10-CM

## 2021-01-30 LAB — POCT URINALYSIS DIP (DEVICE)
Bilirubin Urine: NEGATIVE
Glucose, UA: NEGATIVE mg/dL
Hgb urine dipstick: NEGATIVE
Ketones, ur: NEGATIVE mg/dL
Nitrite: NEGATIVE
Protein, ur: NEGATIVE mg/dL
Specific Gravity, Urine: 1.02 (ref 1.005–1.030)
Urobilinogen, UA: 0.2 mg/dL (ref 0.0–1.0)
pH: 5.5 (ref 5.0–8.0)

## 2021-01-30 NOTE — Progress Notes (Signed)
Here for nurse visit for self swab. C/o burning in vaginal area, white discharge. , burning with urination. Would like to do self swab and UA to check for yeast and UtI. UA wnl. Explained if wet prep + she will be notified of treatment. She voices understanding. Rawad Bochicchio,RN

## 2021-01-31 LAB — CERVICOVAGINAL ANCILLARY ONLY
Bacterial Vaginitis (gardnerella): POSITIVE — AB
Candida Glabrata: NEGATIVE
Candida Vaginitis: POSITIVE — AB
Chlamydia: NEGATIVE
Comment: NEGATIVE
Comment: NEGATIVE
Comment: NEGATIVE
Comment: NEGATIVE
Comment: NEGATIVE
Comment: NORMAL
Neisseria Gonorrhea: NEGATIVE
Trichomonas: NEGATIVE

## 2021-02-01 ENCOUNTER — Other Ambulatory Visit: Payer: Self-pay | Admitting: Obstetrics and Gynecology

## 2021-02-01 DIAGNOSIS — N760 Acute vaginitis: Secondary | ICD-10-CM

## 2021-02-01 DIAGNOSIS — B9689 Other specified bacterial agents as the cause of diseases classified elsewhere: Secondary | ICD-10-CM

## 2021-02-01 MED ORDER — FLUCONAZOLE 150 MG PO TABS: 150.0000 mg | ORAL_TABLET | Freq: Once | ORAL | 1 refills | Status: AC

## 2021-02-01 MED ORDER — METRONIDAZOLE 500 MG PO TABS: 500.0000 mg | ORAL_TABLET | Freq: Two times a day (BID) | ORAL | 0 refills | Status: DC

## 2021-02-01 NOTE — Progress Notes (Signed)
Rx for BV and yeast sent to pt's pharamcy

## 2021-02-12 ENCOUNTER — Telehealth: Payer: Self-pay | Admitting: *Deleted

## 2021-02-12 DIAGNOSIS — N898 Other specified noninflammatory disorders of vagina: Secondary | ICD-10-CM

## 2021-02-12 MED ORDER — FLUCONAZOLE 150 MG PO TABS: 150.0000 mg | ORAL_TABLET | Freq: Once | ORAL | 0 refills | Status: AC

## 2021-02-12 NOTE — Telephone Encounter (Signed)
[name] Alvarez left a voice message yesterday pm stating she was seen in the office last week for an infection. States she was prescribed medicine but states infection not cleared up. Wants to know if she has to come in again or if something can be prescribed.  [name] Alvarez I called [name] Alvarez and we discussed she feels she still has yeast infection. States took diflucan, waited one day and took 2nd diflucan. Still c/o white discharge, vaginal itching and irritation. Diflucan prescribed and suggested might want to buy over the counter monistat.  Also discussed treated for BV after 01/10/21 visit and understands + after 5/ 18/22 visit but did not repeat treatment since she isn't having symptoms, but will take if bv symptoms return.  [name] Colvard,RN

## 2021-04-18 ENCOUNTER — Ambulatory Visit (INDEPENDENT_AMBULATORY_CARE_PROVIDER_SITE_OTHER): Payer: Self-pay

## 2021-04-18 ENCOUNTER — Other Ambulatory Visit (HOSPITAL_COMMUNITY)
Admission: RE | Admit: 2021-04-18 | Discharge: 2021-04-18 | Disposition: A | Discharge status: Home / Self Care | Payer: Medicaid Other | Source: Ambulatory Visit | Attending: Family Medicine | Admitting: Family Medicine

## 2021-04-18 VITALS — BP 117/69 | HR 85 | Wt 156.7 lb

## 2021-04-18 DIAGNOSIS — N898 Other specified noninflammatory disorders of vagina: Secondary | ICD-10-CM

## 2021-04-18 MED ORDER — METRONIDAZOLE 500 MG PO TABS: 500.0000 mg | ORAL_TABLET | Freq: Two times a day (BID) | ORAL | 0 refills | Status: DC

## 2021-04-18 MED ORDER — FLUCONAZOLE 150 MG PO TABS: 150.0000 mg | ORAL_TABLET | Freq: Once | ORAL | 1 refills | Status: AC

## 2021-04-18 NOTE — Patient Instructions (Signed)

## 2021-04-18 NOTE — Progress Notes (Signed)
Patient was assessed and managed by nursing staff during this encounter. I have reviewed the chart and agree with the documentation and plan. I have also made any necessary editorial changes.  Doryce Mcgregory A Blair Mesina, MD 04/18/2021 4:39 PM   

## 2021-04-18 NOTE — Progress Notes (Signed)
Pt here today for self swab with c/o vaginal discharge with fishy odor.  Pt reports that she has a smell usually after sex with the same partner. Pt states that she gets BV all the time. Pt e-prescribed per standing protocol Flagyl 500 mg po bid x 7 days and Diflucan 150 mg po once due to pt's sx's and pt reporting that she always gets a yeast infection after taking medication.  Pt explained that if her self swab results other than BV and/or yeast we will call her with results.   Pt verbalized understanding with no further questions.   Addison Naegeli, RN  04/18/21

## 2021-04-19 LAB — CERVICOVAGINAL ANCILLARY ONLY
Bacterial Vaginitis (gardnerella): POSITIVE — AB
Candida Glabrata: NEGATIVE
Candida Vaginitis: POSITIVE — AB
Chlamydia: NEGATIVE
Comment: NEGATIVE
Comment: NEGATIVE
Comment: NEGATIVE
Comment: NEGATIVE
Comment: NEGATIVE
Comment: NORMAL
Neisseria Gonorrhea: NEGATIVE
Trichomonas: NEGATIVE

## 2021-04-22 ENCOUNTER — Telehealth: Payer: Self-pay | Admitting: Lactation Services

## 2021-04-22 NOTE — Telephone Encounter (Signed)
-----   Message from Warden Fillers, MD sent at 04/20/2021  5:06 AM EDT ----- Vaginal swab showed yeast and bacterial vaginosis, will offer treatment

## 2021-04-22 NOTE — Telephone Encounter (Signed)
Called patient with vaginal swab results. She did not answer. Patient previously treated when vaginal swab was obtained. LM for patient to call the office with any questions or concerns as needed.

## 2021-07-03 ENCOUNTER — Other Ambulatory Visit: Payer: Self-pay

## 2021-07-03 ENCOUNTER — Ambulatory Visit: Payer: Medicaid Other

## 2021-07-04 ENCOUNTER — Ambulatory Visit: Payer: Medicaid Other

## 2021-11-04 ENCOUNTER — Other Ambulatory Visit: Payer: Self-pay

## 2021-11-04 ENCOUNTER — Other Ambulatory Visit (HOSPITAL_COMMUNITY)
Admission: RE | Admit: 2021-11-04 | Discharge: 2021-11-04 | Disposition: A | Discharge status: Home / Self Care | Payer: Medicaid Other | Source: Ambulatory Visit | Attending: Family Medicine | Admitting: Family Medicine

## 2021-11-04 ENCOUNTER — Ambulatory Visit (INDEPENDENT_AMBULATORY_CARE_PROVIDER_SITE_OTHER): Payer: Self-pay

## 2021-11-04 VITALS — BP 129/91 | HR 75 | Wt 159.8 lb

## 2021-11-04 DIAGNOSIS — N898 Other specified noninflammatory disorders of vagina: Secondary | ICD-10-CM | POA: Diagnosis not present

## 2021-11-04 DIAGNOSIS — R3915 Urgency of urination: Secondary | ICD-10-CM

## 2021-11-04 LAB — POCT URINALYSIS DIP (DEVICE)
Bilirubin Urine: NEGATIVE
Glucose, UA: NEGATIVE mg/dL
Ketones, ur: NEGATIVE mg/dL
Leukocytes,Ua: NEGATIVE
Nitrite: NEGATIVE
Protein, ur: NEGATIVE mg/dL
Specific Gravity, Urine: 1.025 (ref 1.005–1.030)
Urobilinogen, UA: 0.2 mg/dL (ref 0.0–1.0)
pH: 8.5 — ABNORMAL HIGH (ref 5.0–8.0)

## 2021-11-04 NOTE — Progress Notes (Signed)
Here today with complaint of abnormal vaginal odor. Also reports urinary urgency. Pt denies any other s/s of UTI. UA is unremarkable. Reviewed with patient it is unlikely that she is experiencing a UTI. Pt will follow up with any worsening symptoms.   Vaginal self swab instructions given and specimen obtained. Explained office will contact patient with any abnormal results.   Fleet Contras RN 11/04/21

## 2021-11-05 LAB — CERVICOVAGINAL ANCILLARY ONLY
Bacterial Vaginitis (gardnerella): POSITIVE — AB
Candida Glabrata: NEGATIVE
Candida Vaginitis: NEGATIVE
Chlamydia: NEGATIVE
Comment: NEGATIVE
Comment: NEGATIVE
Comment: NEGATIVE
Comment: NEGATIVE
Comment: NEGATIVE
Comment: NORMAL
Neisseria Gonorrhea: NEGATIVE
Trichomonas: NEGATIVE

## 2021-11-06 ENCOUNTER — Telehealth: Payer: Self-pay

## 2021-11-06 MED ORDER — METRONIDAZOLE 500 MG PO TABS: 500.0000 mg | ORAL_TABLET | Freq: Two times a day (BID) | ORAL | 0 refills | Status: DC

## 2021-11-06 NOTE — Telephone Encounter (Signed)
-----   Message from [name], MD sent at 11/06/2021 10:39 AM EST ----- Please send in Rx for BV as per protocol. Pt is aware Thanks  [name] Alvarez

## 2021-11-06 NOTE — Telephone Encounter (Signed)
-----   Message from [name], MD sent at 11/06/2021 10:39 AM EST ----- Please send in Rx for BV as per protocol. Pt is aware Thanks  Casimiro Needle   Left message pt stating that I am calling with results.  A medication has been sent to your [facility] pharmacy on [address]. Please return call to the office or respond to MyChart message. MyChart message sent.    Leonette Nutting  11/06/21

## 2021-12-17 ENCOUNTER — Encounter: Payer: Self-pay | Admitting: Obstetrics and Gynecology

## 2021-12-17 ENCOUNTER — Ambulatory Visit (INDEPENDENT_AMBULATORY_CARE_PROVIDER_SITE_OTHER): Payer: Medicaid Other | Admitting: Obstetrics and Gynecology

## 2021-12-17 DIAGNOSIS — Z202 Contact with and (suspected) exposure to infections with a predominantly sexual mode of transmission: Secondary | ICD-10-CM | POA: Diagnosis not present

## 2021-12-17 DIAGNOSIS — Z01419 Encounter for gynecological examination (general) (routine) without abnormal findings: Secondary | ICD-10-CM | POA: Diagnosis not present

## 2021-12-17 NOTE — Patient Instructions (Signed)

## 2021-12-17 NOTE — Progress Notes (Signed)
[name] Alvarez is a 26 y.o. [id number] female here for a routine annual gynecologic exam.  Current complaints: desires pregnancy and blood STD testing. Declines vaginal STD testing.   Denies abnormal vaginal bleeding, discharge, pelvic pain, problems with intercourse or other gynecologic concerns.  ? ?Has been attempting to conceive x 1 yr without success ?Gynecologic History ?No LMP recorded. ?Contraception: none ?Last Pap: 11/12. Results were: normal ?Last mammogram: NA.  ? ?Obstetric History ?OB History  ?Gravida Para Term Preterm AB Living  ?2 2 2  0 0 2  ?SAB IAB Ectopic Multiple Live Births  ?0 0 0 0 2  ?  ?# Outcome Date GA Lbr Len/2nd Weight Sex Delivery Anes PTL Lv  ?2 Term 11/13/14 [redacted]w[redacted]d  6 lb 13 oz (3.09 kg) F CS-LTranv   LIV  ?1 Term 02/15/12    F CS-Unspec   LIV  ? ? ?History reviewed. No pertinent past medical history. ? ?Past Surgical History:  ?Procedure Laterality Date  ? CESAREAN SECTION  2013  ? [place]  ? CESAREAN SECTION N/A 11/13/2014  ? Procedure: CESAREAN SECTION;  Surgeon: 11/15/2014, MD;  Location: [facility];  Service: Obstetrics;  Laterality: N/A;  ? ? ?No current outpatient medications on file prior to visit.  ? ?No current facility-administered medications on file prior to visit.  ? ? ?No Known Allergies ? ?Social History  ? ?Socioeconomic History  ? Marital status: Single  ?  Spouse name: Not on file  ? Number of children: Not on file  ? Years of education: Not on file  ? Highest education level: Not on file  ?Occupational History  ? Not on file  ?Tobacco Use  ? Smoking status: Never  ? Smokeless tobacco: Never  ?Vaping Use  ? Vaping Use: Never used  ?Substance and Sexual Activity  ? Alcohol use: Yes  ?  Comment: occ  ? Drug use: No  ? Sexual activity: Yes  ?  Birth control/protection: None  ?Other Topics Concern  ? Not on file  ?Social History Narrative  ? Not on file  ? ?Social Determinants of Health  ? ?Financial Resource Strain: Not on file  ?Food Insecurity: No Food Insecurity  ? Worried  About Catalina Antigua in the Last Year: Never true  ? Ran Out of Food in the Last Year: Never true  ?Transportation Needs: No Transportation Needs  ? Lack of Transportation (Medical): No  ? Lack of Transportation (Non-Medical): No  ?Physical Activity: Not on file  ?Stress: Not on file  ?Social Connections: Not on file  ?Intimate Partner Violence: Not on file  ? ? ?Family History  ?Problem Relation Age of Onset  ? Hypertension Father   ? ? ?The following portions of the patient's history were reviewed and updated as appropriate: allergies, current medications, past family history, past medical history, past social history, past surgical history and problem list. ? ?Review of Systems ?Pertinent items noted in HPI and remainder of comprehensive ROS otherwise negative. ?  ?Objective:  ?BP 114/73   Pulse 79   Ht 5\' 2"  (1.575 m)   Wt 160 lb 9.6 oz (72.8 kg)   BMI 29.37 kg/m?  ?CONSTITUTIONAL: Well-developed, well-nourished female in no acute distress.  ?HENT:  Normocephalic, atraumatic, External right and left ear normal. Oropharynx is clear and moist ?EYES: Conjunctivae and EOM are normal. Pupils are equal, round, and reactive to light. No scleral icterus.  ?NECK: Normal range of motion, supple, no masses.  Normal thyroid.  ?SKIN: Skin is  warm and dry. No rash noted. Not diaphoretic. No erythema. No pallor. ?NEUROLGIC: Alert and oriented to person, place, and time. Normal reflexes, muscle tone coordination. No cranial nerve deficit noted. ?PSYCHIATRIC: Normal mood and affect. Normal behavior. Normal judgment and thought content. ?CARDIOVASCULAR: Normal heart rate noted, regular rhythm ?RESPIRATORY: Clear to auscultation bilaterally. Effort and breath sounds normal, no problems with respiration noted. ?BREASTS: Deferred ?ABDOMEN: Soft, normal bowel sounds, no distention noted.  No tenderness, rebound or guarding.  ?PELVIC: Deferred ?MUSCULOSKELETAL: Normal range of motion. No tenderness.  No cyanosis, clubbing, or  edema.  2+ distal pulses. ? ? ?Assessment:  ?Annual gynecologic examination without pap smear ?STD test, blood only ?Desire for pregnancy ?Plan:  ?Pt informed that Family planning does not cover w/u for infertility. Advised to apply for Medicaid and schedule a follow up appt for additional work up and discuss ?Routine preventative health maintenance measures emphasized. ?Please refer to After Visit Summary for other counseling recommendations.  ? ? ?[name], MD, FACOG ?Attending Obstetrician & Gynecologist ?Center for [employer], [facility] ? ?

## 2021-12-19 LAB — HEPATITIS C ANTIBODY: Hep C Virus Ab: NONREACTIVE

## 2021-12-20 LAB — HIV ANTIBODY (ROUTINE TESTING W REFLEX): HIV Screen 4th Generation wRfx: NONREACTIVE

## 2021-12-20 LAB — HEPATITIS B SURFACE ANTIGEN: Hepatitis B Surface Ag: NEGATIVE

## 2021-12-20 LAB — RPR: RPR Ser Ql: NONREACTIVE

## 2021-12-26 ENCOUNTER — Ambulatory Visit (INDEPENDENT_AMBULATORY_CARE_PROVIDER_SITE_OTHER): Payer: Self-pay

## 2021-12-26 DIAGNOSIS — Z3200 Encounter for pregnancy test, result unknown: Secondary | ICD-10-CM

## 2021-12-26 DIAGNOSIS — Z3201 Encounter for pregnancy test, result positive: Secondary | ICD-10-CM

## 2021-12-26 LAB — POCT PREGNANCY, URINE: Preg Test, Ur: POSITIVE — AB

## 2021-12-26 MED ORDER — PRENATAL 27-1 MG PO TABS: 1.0000 | ORAL_TABLET | Freq: Every day | ORAL | 11 refills | Status: DC

## 2021-12-26 NOTE — Progress Notes (Signed)
Possible Pregnancy ? ?Here today for pregnancy confirmation. UPT in office today is positive. IUD removed 1.5 years ago. Reports monthly menstrual period. Reviewed dating with patient:  ? ?LMP: 11/16/21 ?EDD: 08/23/22 ?5w 5d today ? ?OB history reviewed; 2 prior c-sections. Reviewed medications and allergies with patient. Recommended pt begin prenatal vitamin and schedule prenatal care. Pt would like to schedule prenatal care in our office with female provider. ? ?Marjo Bicker, RN ?12/26/2021  1:58 PM ? ?

## 2022-01-23 ENCOUNTER — Telehealth: Payer: Medicaid Other

## 2022-02-06 ENCOUNTER — Encounter: Payer: Self-pay | Admitting: Family Medicine

## 2022-02-13 ENCOUNTER — Ambulatory Visit (HOSPITAL_COMMUNITY)
Admission: RE | Admit: 2022-02-13 | Discharge: 2022-02-13 | Disposition: A | Discharge status: Home / Self Care | Payer: Medicaid Other | Source: Ambulatory Visit | Attending: Emergency Medicine | Admitting: Emergency Medicine

## 2022-02-13 ENCOUNTER — Encounter (HOSPITAL_COMMUNITY): Payer: Self-pay

## 2022-02-13 VITALS — BP 123/86 | HR 82 | Temp 98.2°F | Resp 16

## 2022-02-13 DIAGNOSIS — N898 Other specified noninflammatory disorders of vagina: Secondary | ICD-10-CM | POA: Insufficient documentation

## 2022-02-13 DIAGNOSIS — Z202 Contact with and (suspected) exposure to infections with a predominantly sexual mode of transmission: Secondary | ICD-10-CM | POA: Insufficient documentation

## 2022-02-13 MED ORDER — CEFTRIAXONE SODIUM 500 MG IJ SOLR
INTRAMUSCULAR | Status: AC
  Filled 2022-02-13: qty 500

## 2022-02-13 MED ORDER — FLUCONAZOLE 150 MG PO TABS: 150.0000 mg | ORAL_TABLET | Freq: Every day | ORAL | 0 refills | Status: AC

## 2022-02-13 MED ORDER — CEFTRIAXONE SODIUM 500 MG IJ SOLR
500.0000 mg | Freq: Once | INTRAMUSCULAR | Status: AC
  Administered 2022-02-13: 500 mg via INTRAMUSCULAR

## 2022-02-13 MED ORDER — LIDOCAINE HCL (PF) 1 % IJ SOLN
INTRAMUSCULAR | Status: AC
  Filled 2022-02-13: qty 2

## 2022-02-13 NOTE — ED Provider Notes (Signed)
[facility]    CSN: [id number] Arrival date & time: 02/13/22  4268      History   Chief Complaint Chief Complaint  Patient presents with   SEXUALLY TRANSMITTED DISEASE    Have been exposed - Entered by patient    HPI Marely Apgar is a 26 y.o. female.   Patient presents with vaginal itching, unsure if related to STI recent shaving the vulva .  Known exposure to gonorrhea and have advised .sexually active, treatment appointments, sometimes condom use.  Denies urinary frequency, urgency, hematuria, dysuria, flank pain, fever, chills, rash or lesions.  Last menstrual period 01/18/2022.  History reviewed. No pertinent past medical history.  Patient Active Problem List   Diagnosis Date Noted   Visit for routine gyn exam 12/17/2021   STD exposure 12/17/2021   S/P cesarean section 11/13/2014   History of cesarean section complicating pregnancy 09/13/2014   Sickle cell trait (HCC) 09/11/2014   Rubella nonimmune 09/04/2014    Past Surgical History:  Procedure Laterality Date   CESAREAN SECTION  2013   [place]   CESAREAN SECTION N/A 11/13/2014   Procedure: CESAREAN SECTION;  Surgeon: Catalina Antigua, MD;  Location: [facility];  Service: Obstetrics;  Laterality: N/A;    OB History     Gravida  2   Para  2   Term  2   Preterm  0   AB  0   Living  2      SAB  0   IAB  0   Ectopic  0   Multiple  0   Live Births  2            Home Medications    Prior to Admission medications   Medication Sig Start Date End Date Taking? Authorizing Provider  Prenatal 27-1 MG TABS Take 1 tablet by mouth daily. 12/26/21  Yes Federico Flake, MD    Family History Family History  Problem Relation Age of Onset   Hypertension Father     Social History Social History   Tobacco Use   Smoking status: Never   Smokeless tobacco: Never  Vaping Use   Vaping Use: Never used  Substance Use Topics   Alcohol use: Not Currently    Comment: occ   Drug use: No      Allergies   Patient has no known allergies.   Review of Systems Review of Systems  Constitutional: Negative.   Respiratory: Negative.    Cardiovascular: Negative.   Genitourinary: Negative.   Skin: Negative.   Neurological: Negative.     Physical Exam Triage Vital Signs ED Triage Vitals  Enc Vitals Group     BP 02/13/22 0945 123/86     Pulse Rate 02/13/22 0945 82     Resp 02/13/22 0945 16     Temp 02/13/22 0945 98.2 F (36.8 C)     Temp Source 02/13/22 0945 Oral     SpO2 02/13/22 0945 98 %     Weight --      Height --      Head Circumference --      Peak Flow --      Pain Score 02/13/22 0943 0     Pain Loc --      Pain Edu? --      Excl. in GC? --    No data found.  Updated Vital Signs BP 123/86 (BP Location: Right Leg)   Pulse 82   Temp 98.2 F (36.8 C) (Oral)  Resp 16   LMP 01/18/2022 (Exact Date)   SpO2 98%   Visual Acuity Right Eye Distance:   Left Eye Distance:   Bilateral Distance:    Right Eye Near:   Left Eye Near:    Bilateral Near:     Physical Exam Constitutional:      Appearance: Normal appearance.  HENT:     Head: Normocephalic.  Eyes:     Extraocular Movements: Extraocular movements intact.  Pulmonary:     Effort: Pulmonary effort is normal.  Genitourinary:    Comments: deferred Skin:    General: Skin is warm and dry.  Neurological:     Mental Status: She is alert and oriented to person, place, and time. Mental status is at baseline.  Psychiatric:        Mood and Affect: Mood normal.        Behavior: Behavior normal.     UC Treatments / Results  Labs (all labs ordered are listed, but only abnormal results are displayed) Labs Reviewed  CERVICOVAGINAL ANCILLARY ONLY    EKG   Radiology No results found.  Procedures Procedures (including critical care time)  Medications Ordered in UC Medications - No data to display  Initial Impression / Assessment and Plan / UC Course  I have reviewed the triage vital  signs and the nursing notes.  Pertinent labs & imaging results that were available during my care of the patient were reviewed by me and considered in my medical decision making (see chart for details).  Vaginal itching Exposure to gonorrhea  We will treat prophylactically for gonorrhea due to known exposure, Rocephin injection given in office, advised abstinence for 7 days, STI labs pending, will check, advised abstinence until symptoms resolve, treatment is complete and all labs have resulted, recommended condom use during all sexual encounters moving forward, may follow-up with Final Clinical Impressions(s) / UC Diagnoses   Final diagnoses:  Exposure to gonorrhea     Discharge Instructions      Today you will be treated prophylactically for tachycardia due to known exposure  You have been given an injection of Rocephin in the office, do not have sex for 7 days   Labs pending 2-3 days, you will be contacted if positive for any sti and treatment will be sent to the pharmacy, you will have to return to the clinic if positive for gonorrhea to receive treatment   Please refrain from having sex until labs results, if positive please refrain from having sex until treatment complete and symptoms resolve   If positive for Chlamydia  gonorrhea or trichomoniasis please notify partner or partners so they may tested as well  Moving forward, it is recommended you use some form of protection against the transmission of sti infections  such as condoms or dental dams with each sexual encounter     ED Prescriptions   None    PDMP not reviewed this encounter.   Valinda Hoar, [place] 02/13/22 [phone]

## 2022-02-13 NOTE — Discharge Instructions (Addendum)
Today you will be treated prophylactically for gonorrhea due to known exposure  You have been given an injection of Rocephin in the office, do not have sex for 7 days   Labs pending 2-3 days, you will be contacted if positive for any sti and treatment will be sent to the pharmacy, you will have to return to the clinic if positive for gonorrhea to receive treatment   Please refrain from having sex until labs results, if positive please refrain from having sex until treatment complete and symptoms resolve   If positive for Chlamydia  gonorrhea or trichomoniasis please notify partner or partners so they may tested as well  Moving forward, it is recommended you use some form of protection against the transmission of sti infections  such as condoms or dental dams with each sexual encounter

## 2022-02-13 NOTE — ED Triage Notes (Signed)
Patient stated that recent partner tested positive for gonorrhea. Condom broke that was used. Patient states she has been itching in the groin area but had recently shaved.

## 2022-02-17 ENCOUNTER — Telehealth (HOSPITAL_COMMUNITY): Payer: Self-pay

## 2022-02-17 LAB — CERVICOVAGINAL ANCILLARY ONLY
Bacterial Vaginitis (gardnerella): POSITIVE — AB
Candida Glabrata: NEGATIVE
Candida Vaginitis: NEGATIVE
Chlamydia: NEGATIVE
Comment: NEGATIVE
Comment: NEGATIVE
Comment: NEGATIVE
Comment: NEGATIVE
Comment: NEGATIVE
Comment: NORMAL
Neisseria Gonorrhea: NEGATIVE
Trichomonas: NEGATIVE

## 2022-02-17 MED ORDER — METRONIDAZOLE 500 MG PO TABS: 500.0000 mg | ORAL_TABLET | Freq: Two times a day (BID) | ORAL | 0 refills | Status: DC

## 2022-02-18 ENCOUNTER — Ambulatory Visit: Payer: Medicaid Other

## 2022-05-09 IMAGING — US US PELVIS COMPLETE WITH TRANSVAGINAL
1 series · 15 of 25 positions shown · non-contrast
Comparison: None available.

CLINICAL DATA: Initial evaluation for IUD placement, threads lost.



[Series 1: us pelvis complete with transvaginal · 122 acquisitions, 15 frames shown]
[im 1/122]
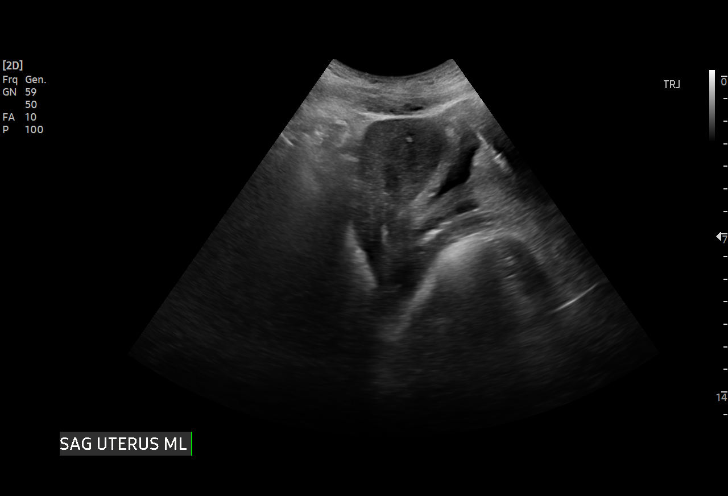
[im 11/122]
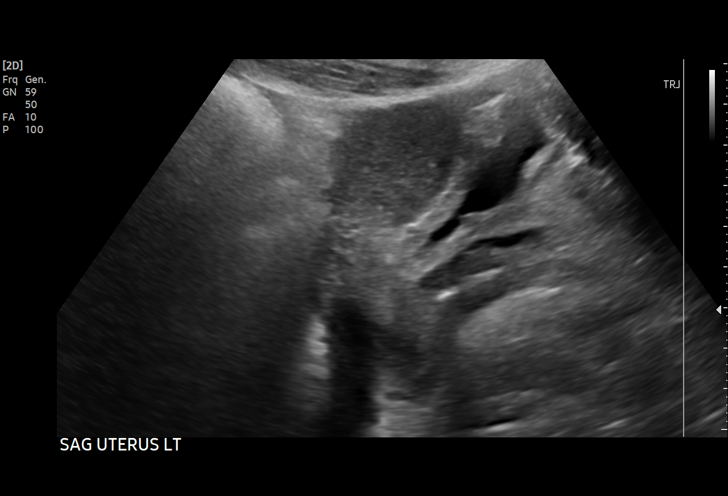
[im 21/122]
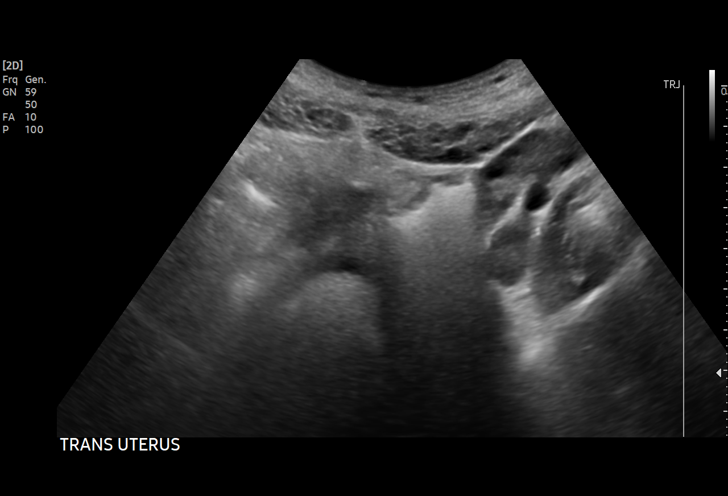
[im 26/122]
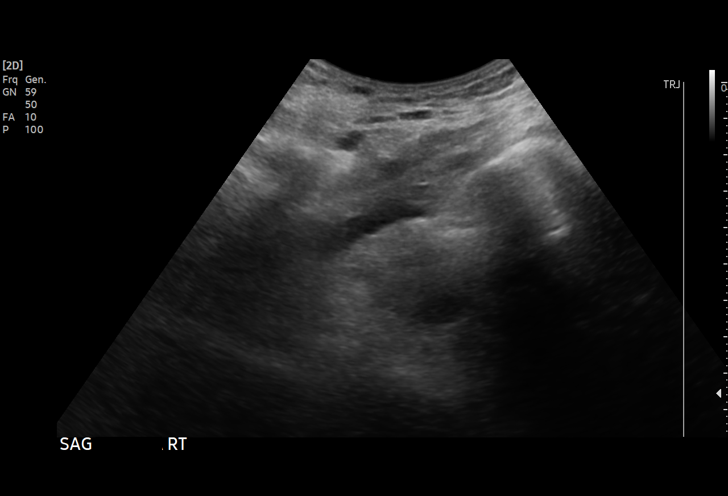
[im 36/122]
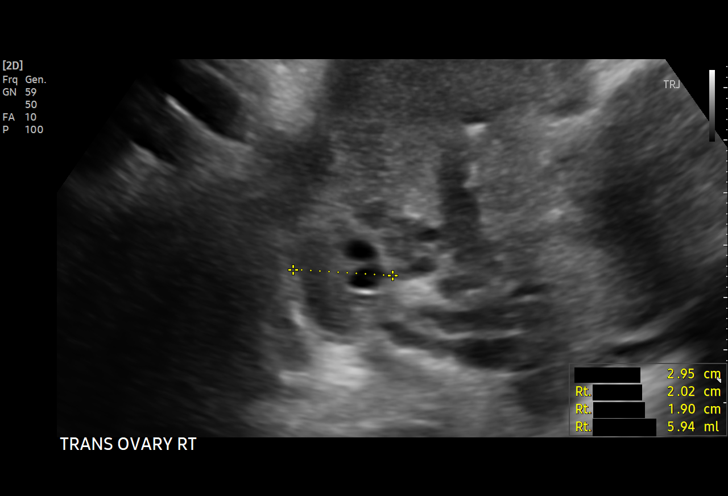
[im 46/122]
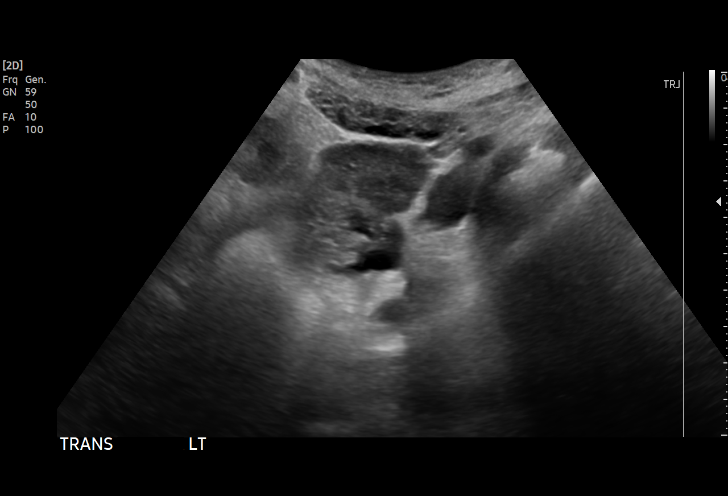
[im 51/122]
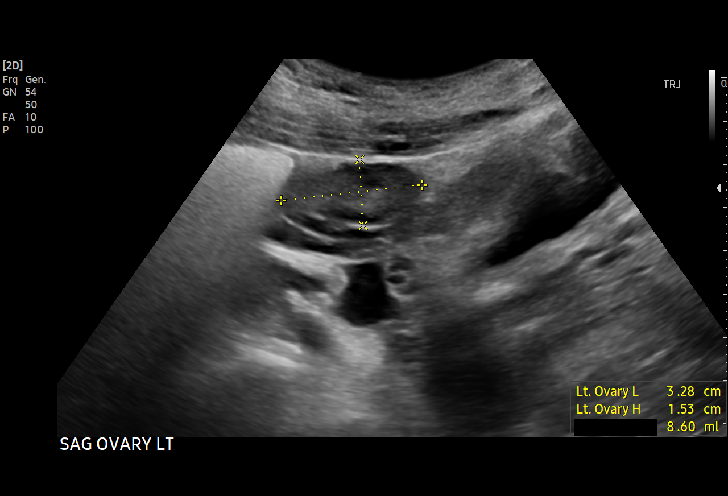
[im 61/122]
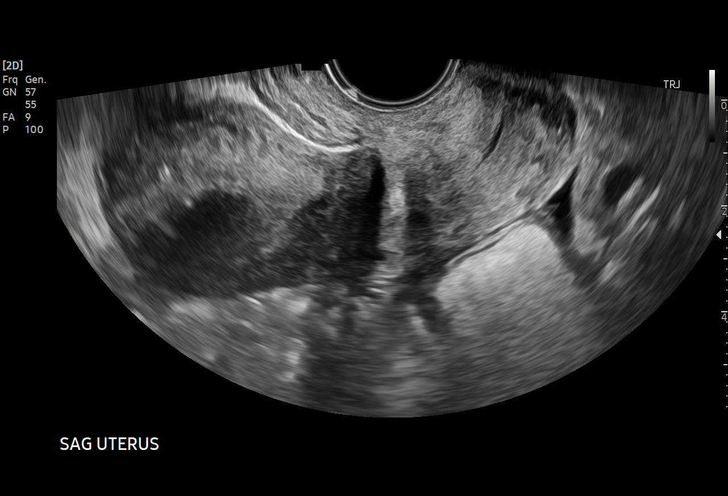
[im 71/122]
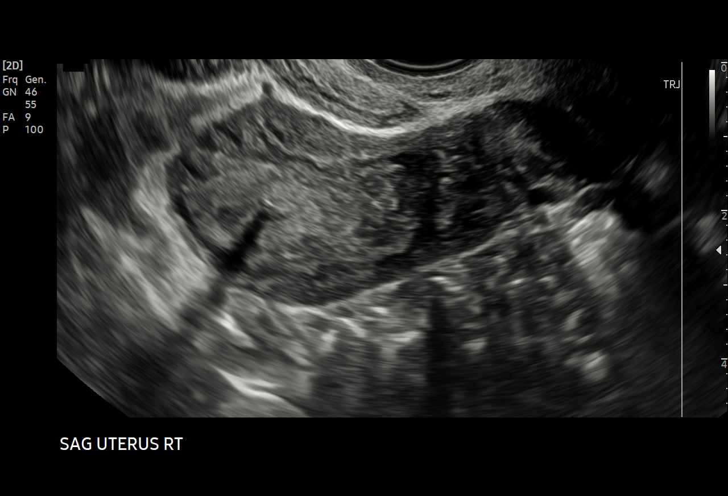
[im 76/122]
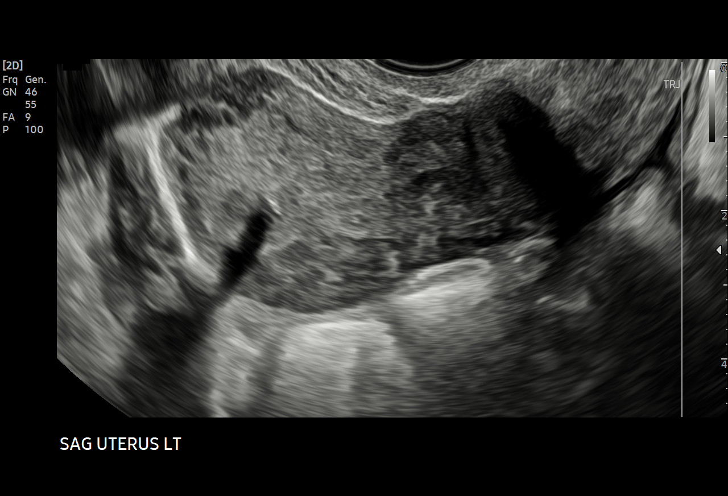
[im 86/122]
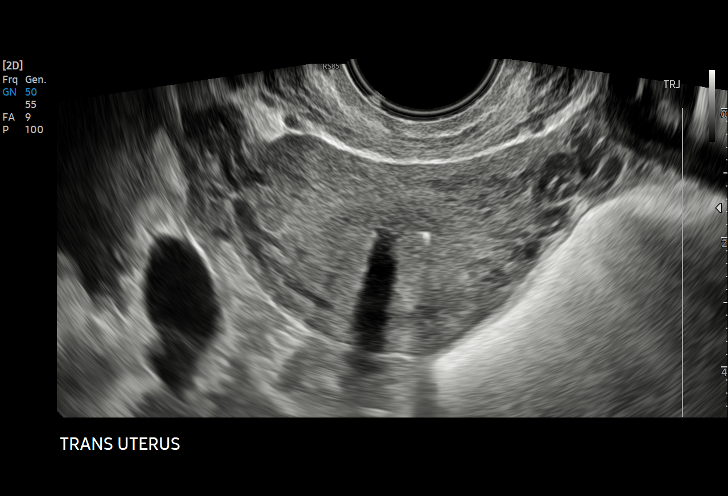
[im 96/122]
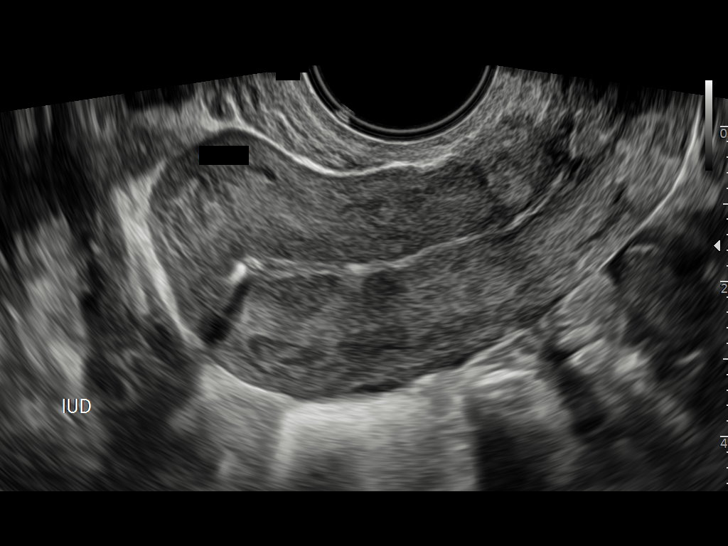
[im 101/122]
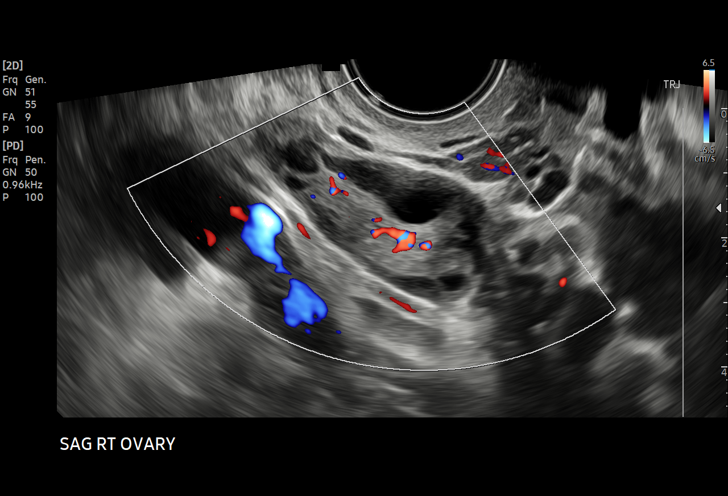
[im 111/122]
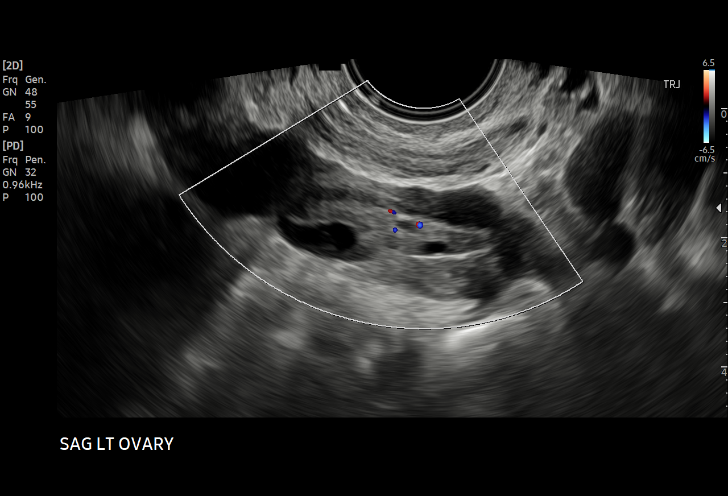
[im 122/122]
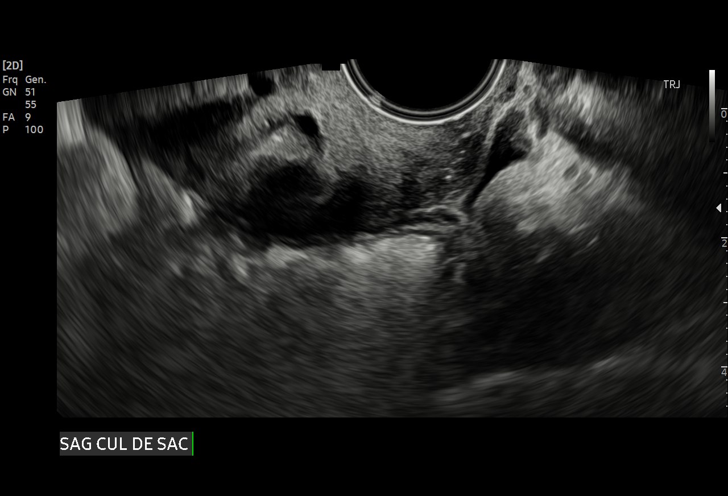

[15 of 25 positions shown; findings below may reference images not displayed]

FINDINGS: Uterus

Measurements: 8.0 x 3.9 x 4.4 cm = volume: 71.9 mL. Uterus is
anteverted. No discrete fibroid or other mass.

Endometrium

Thickness: 2.5 mm. No focal abnormality visualized. IUD in
appropriate position within the endometrial cavity at the level of
the uterine fundus/body.

Right ovary

Measurements: 4.1 x 1.9 x 2.4 cm = volume: 9.6 mL. Normal
appearance/no adnexal mass.

Left ovary

Measurements: 3.9 x 1.4 x 2.5 cm = volume: 7.2 mL. Normal
appearance/no adnexal mass.

Other findings

Trace free fluid noted within the pelvis.
IMPRESSION: 1. IUD in appropriate position within the endometrial cavity at the
level of the uterine fundus/body.
2. Otherwise unremarkable and normal pelvic ultrasound.

## 2022-07-15 ENCOUNTER — Ambulatory Visit (INDEPENDENT_AMBULATORY_CARE_PROVIDER_SITE_OTHER): Payer: Self-pay

## 2022-07-15 ENCOUNTER — Other Ambulatory Visit (HOSPITAL_COMMUNITY)
Admission: RE | Admit: 2022-07-15 | Discharge: 2022-07-15 | Disposition: A | Discharge status: Home / Self Care | Payer: Medicaid Other | Source: Ambulatory Visit | Attending: Family Medicine | Admitting: Family Medicine

## 2022-07-15 ENCOUNTER — Other Ambulatory Visit: Payer: Self-pay

## 2022-07-15 VITALS — BP 127/82 | HR 76

## 2022-07-15 DIAGNOSIS — Z202 Contact with and (suspected) exposure to infections with a predominantly sexual mode of transmission: Secondary | ICD-10-CM | POA: Insufficient documentation

## 2022-07-15 NOTE — Progress Notes (Signed)
Pt presents today for STD testing. Pt educated in how to perform a self swab. Swab obtained. Blood work ordered. Pt informed results would come back in 24-48 hours and she would be notified via [place]. Pt verbalized understanding and denied further questions.

## 2022-07-16 ENCOUNTER — Other Ambulatory Visit: Payer: Self-pay | Admitting: Obstetrics and Gynecology

## 2022-07-16 DIAGNOSIS — Z202 Contact with and (suspected) exposure to infections with a predominantly sexual mode of transmission: Secondary | ICD-10-CM

## 2022-07-16 DIAGNOSIS — A749 Chlamydial infection, unspecified: Secondary | ICD-10-CM

## 2022-07-16 DIAGNOSIS — A599 Trichomoniasis, unspecified: Secondary | ICD-10-CM

## 2022-07-16 LAB — CERVICOVAGINAL ANCILLARY ONLY
Bacterial Vaginitis (gardnerella): NEGATIVE
Candida Glabrata: NEGATIVE
Candida Vaginitis: NEGATIVE
Chlamydia: POSITIVE — AB
Comment: NEGATIVE
Comment: NEGATIVE
Comment: NEGATIVE
Comment: NEGATIVE
Comment: NEGATIVE
Comment: NORMAL
Neisseria Gonorrhea: NEGATIVE
Trichomonas: POSITIVE — AB

## 2022-07-16 LAB — RPR: RPR Ser Ql: NONREACTIVE

## 2022-07-16 LAB — HEPATITIS B SURFACE ANTIGEN: Hepatitis B Surface Ag: NEGATIVE

## 2022-07-16 LAB — HIV ANTIBODY (ROUTINE TESTING W REFLEX): HIV Screen 4th Generation wRfx: NONREACTIVE

## 2022-07-16 MED ORDER — METRONIDAZOLE 500 MG PO TABS: 500.0000 mg | ORAL_TABLET | Freq: Two times a day (BID) | ORAL | 0 refills | Status: DC

## 2022-07-16 MED ORDER — FLUCONAZOLE 150 MG PO TABS: 150.0000 mg | ORAL_TABLET | Freq: Once | ORAL | 1 refills | Status: DC | PRN

## 2022-07-16 MED ORDER — DOXYCYCLINE HYCLATE 100 MG PO CAPS: 100.0000 mg | ORAL_CAPSULE | Freq: Two times a day (BID) | ORAL | 1 refills | Status: AC

## 2022-07-17 ENCOUNTER — Telehealth: Payer: Self-pay | Admitting: *Deleted

## 2022-07-17 ENCOUNTER — Encounter: Payer: Self-pay | Admitting: *Deleted

## 2022-07-17 NOTE — Telephone Encounter (Signed)
Per chart review [name] reviewed message. I called [name] Alvarez to see if she had any questions. I answered her questions and reinforced partner needs to be treated by his PCP or [facility] and avoid intercourse/ sexual contact until 2 weeks after last person finishes treatment. She also states she sees that the provider sent in one diflucan and she prefers 2. I instructed her to take Diflucan after she completes antibiotics and if needs a repeat , send message and also that she can take sooner if she starts getting symptoms of yeast and uncomfortable. She voices understanding. STD form for [facility] completed. [name]

## 2022-07-17 NOTE — Telephone Encounter (Signed)
-----   Message from Darliss Cheney, MD sent at 07/16/2022  6:47 PM EDT ----- Pt positive for trichomonas and chlamydia. Rx sent

## 2022-08-04 ENCOUNTER — Ambulatory Visit: Payer: Medicaid Other

## 2022-08-18 ENCOUNTER — Ambulatory Visit (INDEPENDENT_AMBULATORY_CARE_PROVIDER_SITE_OTHER): Payer: Self-pay

## 2022-08-18 DIAGNOSIS — Z3200 Encounter for pregnancy test, result unknown: Secondary | ICD-10-CM

## 2022-08-18 DIAGNOSIS — Z3201 Encounter for pregnancy test, result positive: Secondary | ICD-10-CM

## 2022-08-18 LAB — POCT PREGNANCY, URINE: Preg Test, Ur: POSITIVE — AB

## 2022-08-18 NOTE — Progress Notes (Signed)
Possible Pregnancy  Here today for pregnancy confirmation. UPT in office today is positive. Pt reports first positive home UPT on 08/08/2022. Reviewed dating with patient:   LMP: 06/09/2022 - pt unsure about dating. [place] scheduled 12/19.  EDD: 03/16/2023 10w 1d today  OB history reviewed. Reviewed medications and allergies with patient; list of medications safe to take during pregnancy given.  Recommended pt begin prenatal vitamin and schedule prenatal care.  Janeece Agee, RN 08/18/2022  4:09 PM

## 2022-08-26 ENCOUNTER — Telehealth: Payer: Self-pay

## 2022-08-26 NOTE — Telephone Encounter (Signed)
Returned phone call to patient. She stated the cramping has stopped and denied vaginal bleeding. RN advised her to report to MAU should cramping restart/worsen overnight or if she experienced vaginal bleeding. Pt verbalized understanding. Pt scheduled for for dating [place] 12/19. Pt aware of [place]. Pt denied further questions.

## 2022-09-02 ENCOUNTER — Other Ambulatory Visit: Payer: Self-pay | Admitting: Obstetrics & Gynecology

## 2022-09-02 ENCOUNTER — Ambulatory Visit (INDEPENDENT_AMBULATORY_CARE_PROVIDER_SITE_OTHER): Payer: Self-pay

## 2022-09-02 ENCOUNTER — Ambulatory Visit (INDEPENDENT_AMBULATORY_CARE_PROVIDER_SITE_OTHER): Payer: Self-pay | Admitting: Advanced Practice Midwife

## 2022-09-02 ENCOUNTER — Encounter: Payer: Self-pay | Admitting: Advanced Practice Midwife

## 2022-09-02 DIAGNOSIS — A5901 Trichomonal vulvovaginitis: Secondary | ICD-10-CM | POA: Insufficient documentation

## 2022-09-02 DIAGNOSIS — A749 Chlamydial infection, unspecified: Secondary | ICD-10-CM | POA: Insufficient documentation

## 2022-09-02 DIAGNOSIS — Z3200 Encounter for pregnancy test, result unknown: Secondary | ICD-10-CM

## 2022-09-02 DIAGNOSIS — O98811 Other maternal infectious and parasitic diseases complicating pregnancy, first trimester: Secondary | ICD-10-CM

## 2022-09-02 DIAGNOSIS — O219 Vomiting of pregnancy, unspecified: Secondary | ICD-10-CM

## 2022-09-02 DIAGNOSIS — Z3491 Encounter for supervision of normal pregnancy, unspecified, first trimester: Secondary | ICD-10-CM

## 2022-09-02 DIAGNOSIS — Z3A09 9 weeks gestation of pregnancy: Secondary | ICD-10-CM

## 2022-09-02 DIAGNOSIS — O23591 Infection of other part of genital tract in pregnancy, first trimester: Secondary | ICD-10-CM

## 2022-09-02 MED ORDER — ONDANSETRON 4 MG PO TBDP: 4.0000 mg | ORAL_TABLET | Freq: Three times a day (TID) | ORAL | 3 refills | Status: DC | PRN

## 2022-09-02 NOTE — Patient Instructions (Addendum)
[facility]   [facility]             [address], [place], [place] [address] [phone]  [facility]                                                             [address], [address], [place], [place], [address] [phone]  [facility] [place]                                    [address], [address], [place], [place], [address] [phone]  [facility] [place] [address], [place], [place], [address] [phone]  [facility]                                 [address], [place], [place], [address] [phone]  [facility]                                    [address], [place], [place], [address] [phone]  [facility] [address], [address], [place], [place], [address]                              [facility] [address], [place], [place], [address] [phone]  [facility]         Phone: [phone]  [facility]      Phone: [phone]   [facility]      Phone: [phone]  [facility]         Phone: [phone]   [facility]    Phone: [phone]  [facility]      Phone: [phone]  [facility]     Phone: [phone]  [facility]        Phone: [phone]  [facility] (Sheronette Cousins) [phone]  [facility]      Phone: [phone]    CenteringPregnancy is a model of prenatal care that started 30 years ago and is used in about 600 practices around the [place]. You meet with a group of 8-12 women due around the  same time as you. In Centering you will have individual time with the provider and meet as a group. There's much more time for discussion and learning. You will actually have much more time with your provider in Centering than in traditional prenatal care.? You will come directly into the Centering room and will not wait in the lobby so there is no wasted time. You will have 2-hour visits every 4 weeks then every 2 weeks. You will know your Centering prenatal appointments in advance. In your last month of pregnancy, you may also come in for some   individual visits. Additional appointments can be scheduled if you need more care. Studies have shown that CenteringPregnancy improves birth outcomes. We have seen especially big improvements in fewer Black women delivering babies who are too small or born too early. Visit the website [facility]Healthcare for more information. Let your provider or clinic staff know if you want to sign up.       

## 2022-09-02 NOTE — Progress Notes (Signed)
Ultrasounds Results Note  SUBJECTIVE HPI:  Ms. [name] Alvarez is a 26 y.o. [id number] at [redacted]w[redacted]d by LMP who presents to [facility] for followup ultrasound results. The patient reported abdominal pain severela days ago. Still having N/V. Upon review of the patient's records, patient was first seen in  [facility] for pregnancy confirmation and tested positive for Trichomonas and Chlamydia. Pt and partner Tx'd.  Previous HCG's none  Previous US None     Repeat ultrasound was performed earlier today.   No past medical history on file. Past Surgical History:  Procedure Laterality Date   CESAREAN SECTION  2013   [place]   CESAREAN SECTION N/A 11/13/2014   Procedure: CESAREAN SECTION;  Surgeon: Catalina Antigua, MD;  Location: [facility];  Service: Obstetrics;  Laterality: N/A;   Social History   Socioeconomic History   Marital status: Single    Spouse name: Not on file   Number of children: Not on file   Years of education: Not on file   Highest education level: Not on file  Occupational History   Not on file  Tobacco Use   Smoking status: Never   Smokeless tobacco: Never  Vaping Use   Vaping Use: Never used  Substance and Sexual Activity   Alcohol use: Not Currently    Comment: occ   Drug use: No   Sexual activity: Yes    Birth control/protection: None  Other Topics Concern   Not on file  Social History Narrative   Not on file   Social Determinants of Health   Financial Resource Strain: Not on file  Food Insecurity: No Food Insecurity (11/04/2021)   Hunger Vital Sign    Worried About Running Out of Food in the Last Year: Never true    Ran Out of Food in the Last Year: Never true  Transportation Needs: No Transportation Needs (11/04/2021)   PRAPARE - Transportation    Lack of Transportation (Medical): No    Lack of Transportation (Non-Medical): No  Physical Activity: Not on file  Stress: Not on file  Social Connections: Not on file  Intimate Partner Violence: Not on file    Current Outpatient Medications on File Prior to Visit  Medication Sig Dispense Refill   fluconazole (DIFLUCAN) 150 MG tablet Take 1 tablet (150 mg total) by mouth Once PRN for up to 1 dose. Can take additional dose three days later if symptoms persist 1 tablet 1   metroNIDAZOLE (FLAGYL) 500 MG tablet Take 1 tablet (500 mg total) by mouth 2 (two) times daily. 14 tablet 0   Prenatal 27-1 MG TABS Take 1 tablet by mouth daily. 30 tablet 11   No current facility-administered medications on file prior to visit.   No Known Allergies  I have reviewed patient's Past Medical Hx, Surgical Hx, Family Hx, Social Hx, medications and allergies.   Review of Systems Review of Systems  Constitutional: Negative for fever and chills.  Gastrointestinal: Negative for abdominal pain.  Genitourinary: Negative for vaginal bleeding.  Musculoskeletal: Negative for back pain.  Neurological: Negative for dizziness and weakness.    Physical Exam  LMP 07/01/2022 (Exact Date)   Patient's last menstrual period was 07/01/2022 (exact date). GENERAL: Well-developed, well-nourished female in no acute distress.  HEENT: Normocephalic, atraumatic.   LUNGS: Effort normal ABDOMEN: Deferred HEART: Regular rate  SKIN: Warm, dry and without erythema PSYCH: Normal mood and affect NEURO: Alert and oriented x 4  LAB RESULTS No results found for this or any previous visit (  from the past 24 hour(s)).  IMAGING No results found.  ASSESSMENT 1. Nausea and vomiting in pregnancy   2. [redacted] weeks gestation of pregnancy   3. Normal IUP (intrauterine pregnancy) on prenatal ultrasound, first trimester   4. Chlamydia infection affecting pregnancy in first trimester   5. Trichomonal vaginitis during pregnancy, first trimester    PLAN Start prenatal care. Is established with [facility] and would like to continue. Patient advised to start/continue taking prenatal vitamins Rx Zofran Go to MAU as needed for heavy bleeding, abdominal pain  or fever greater than 100.4.  [place], CNM 09/02/2022 10:53 AM

## 2022-10-01 ENCOUNTER — Telehealth (INDEPENDENT_AMBULATORY_CARE_PROVIDER_SITE_OTHER): Payer: Self-pay | Admitting: *Deleted

## 2022-10-01 ENCOUNTER — Encounter: Payer: Self-pay | Admitting: *Deleted

## 2022-10-01 DIAGNOSIS — Z3689 Encounter for other specified antenatal screening: Secondary | ICD-10-CM

## 2022-10-01 DIAGNOSIS — A749 Chlamydial infection, unspecified: Secondary | ICD-10-CM

## 2022-10-01 DIAGNOSIS — A5901 Trichomonal vulvovaginitis: Secondary | ICD-10-CM

## 2022-10-01 DIAGNOSIS — O34219 Maternal care for unspecified type scar from previous cesarean delivery: Secondary | ICD-10-CM

## 2022-10-01 DIAGNOSIS — O0990 Supervision of high risk pregnancy, unspecified, unspecified trimester: Secondary | ICD-10-CM | POA: Insufficient documentation

## 2022-10-01 MED ORDER — BLOOD PRESSURE KIT DEVI: 1.0000 | 0 refills | Status: DC | PRN

## 2022-10-01 MED ORDER — GOJJI WEIGHT SCALE MISC: 1.0000 | Freq: Once | 0 refills | Status: AC

## 2022-10-01 NOTE — Progress Notes (Addendum)
New OB Intake  I connected with [name] Alvarez  on 10/01/22 at 10:15 AM EST by MyChart Video Visit and verified that I am speaking with the correct person using two identifiers. Nurse is located at [facility] and pt is located at work.  I discussed the limitations, risks, security and privacy concerns of performing an evaluation and management service by telephone and the availability of in person appointments. I also discussed with the patient that there may be a patient responsible charge related to this service. The patient expressed understanding and agreed to proceed.  I explained I am completing New OB Intake today. We discussed EDD of 04/07/23 that is based on LMP of 07/01/22. Pt is G4/P2012. I reviewed her allergies, medications, Medical/Surgical/OB history, and appropriate screenings. I informed her of [facility] services. [facility] information placed in AVS. Based on history, this is a high risk pregnancy.  Patient Active Problem List   Diagnosis Date Noted   Supervision of high risk pregnancy, antepartum 10/01/2022   Chlamydia infection affecting pregnancy in first trimester 09/02/2022   Trichomonal vaginitis during pregnancy, first trimester 09/02/2022   History of cesarean section complicating pregnancy 36/14/4315   Sickle cell trait ([place]) 09/11/2014   Rubella nonimmune 09/04/2014    Concerns addressed today  Delivery Plans Plans to deliver at [facility]. Patient given information for [facility] Healthy Baby website for more information about Women's and [place]. Patient is not interested in water birth. Offered upcoming OB visit with CNM to discuss further.  MyChart/Babyscripts MyChart access verified. I explained pt will have some visits in office and some virtually. Babyscripts instructions given and order placed.   Blood Pressure Cuff/Weight Scale Blood pressure cuff ordered for patient to pick-up from [employer]. Explained after first prenatal appt pt will check weekly and document in  53. Patient does not have weight scale; order sent to [facility], patient may track weight weekly in Babyscripts.  Anatomy US Explained first scheduled [place] will be around 19 weeks. Anatomy US scheduled for 10/24/22 at 0745. Pt notified to arrive at 0730.  Labs Discussed Johnsie Cancel genetic screening with patient. Would like both Panorama and Horizon drawn at new OB visit. Routine prenatal labs needed.  COVID Vaccine Patient has not had COVID vaccine.   Is patient a CenteringPregnancy candidate?  Declined Declined due to Group setting   Is patient a Mom+Baby Combined Care candidate?  Not a candidate   I Social Determinants of Health Food Insecurity: Patient denies food insecurity. WIC Referral: Patient is interested in referral to [facility].  Transportation: Patient denies transportation needs. Childcare: Discussed no children allowed at ultrasound appointments. Offered childcare services; patient declines childcare services at this time.  First visit review I reviewed new OB appt with patient. I explained they will have a provider visit that includes pregnancy exam, labs, genetic testing. Explained pt will be seen by Dr. Dione Plover at first visit; encounter routed to appropriate provider. Explained that patient will be seen by pregnancy navigator following visit with provider.   Vaughan Basta [facility] 10/01/2022  11:02 AM

## 2022-10-01 NOTE — Patient Instructions (Addendum)
  At our [facility], we work as an integrated team, providing care to address both physical and emotional health. Your medical provider may refer you to see our Behavioral Health Clinician (BHC) ;scheduled virtually at your convenience.  Our BHC is available to all patients, visits generally last between 20-30 minutes, but can be longer or shorter, depending on patient need. The BHC offers help with stress management, coping with symptoms of depression and anxiety, major life changes , sleep issues, changing risky behavior, grief and loss, life stress, working on personal life goals, and  behavioral health issues, as these all affect your overall health and wellness.  The BHC is NOT available for the following: FMLA paperwork, court-ordered evaluations, specialty assessments (custody or disability), letters to employers, or obtaining certification for an emotional support animal. The BHC does not generally provide long-term therapy. You have the right to refuse integrated behavioral health services, or to reschedule to see the BHC at a later date.  Confidentiality exception: If it is suspected that a child or disabled adult is being abused or neglected, we are required by law to report that to either Child Protective Services or Adult Protective Services.  If you have a diagnosis of Bipolar affective disorder, Schizophrenia, or recurrent Major depressive disorder, we will recommend that you establish care with a psychiatrist, as these are lifelong, chronic conditions, and we want your overall emotional health and medications to be more closely monitored. If you anticipate needing extended maternity leave due to mental health issues postpartum, it it recommended you inform your medical provider, so we can put in a referral to a psychiatrist as soon as possible. The BHC is unable to recommend an extended maternity leave for mental health issues. Your medical provider or BHC may refer you to a  therapist for ongoing, traditional therapy, or to a psychiatrist, for medication management, if it would benefit your overall health. Depending on your insurance, you may have a copay or be charged a deductible, depending on your insurance, to see the BHC. If you are uninsured, it is recommended that you apply for financial assistance. (Forms may be requested at the front desk for in-person visits, via MyChart, or request a form during a virtual visit).  If you see the BHC more than 6 times, you will have to complete a comprehensive clinical assessment interview with the BHC to resume integrated services.  For virtual visits with the BHC, you must be physically in the state of [place] at the time of the visit. For example, if you live in [place], you will have to do an in-person visit with the BHC, and your out-of-state insurance may not cover behavioral health services in [place]. If you are going out of the state or country for any reason, the BHC may see you virtually when you return to [place], but not while you are physically outside of [place].    [email or web address] is a website with information to help you prepare for Labor and delivery, patient information, visitor information and more.    Here is a link to the Pregnancy Navigators . Please Fill out prior to your New OB appointment.   English Link: [email or web address]  Spanish Link: [email or web address]  

## 2022-10-09 ENCOUNTER — Encounter: Payer: Self-pay | Admitting: Family Medicine

## 2022-10-09 ENCOUNTER — Ambulatory Visit (INDEPENDENT_AMBULATORY_CARE_PROVIDER_SITE_OTHER): Payer: Self-pay | Admitting: Family Medicine

## 2022-10-09 ENCOUNTER — Other Ambulatory Visit (HOSPITAL_COMMUNITY)
Admission: RE | Admit: 2022-10-09 | Discharge: 2022-10-09 | Disposition: A | Discharge status: Home / Self Care | Payer: Medicaid Other | Source: Ambulatory Visit | Attending: Family Medicine | Admitting: Family Medicine

## 2022-10-09 ENCOUNTER — Other Ambulatory Visit: Payer: Self-pay

## 2022-10-09 VITALS — BP 125/86 | HR 84 | Wt 151.2 lb

## 2022-10-09 DIAGNOSIS — A749 Chlamydial infection, unspecified: Secondary | ICD-10-CM

## 2022-10-09 DIAGNOSIS — O99891 Other specified diseases and conditions complicating pregnancy: Secondary | ICD-10-CM

## 2022-10-09 DIAGNOSIS — O99892 Other specified diseases and conditions complicating childbirth: Secondary | ICD-10-CM

## 2022-10-09 DIAGNOSIS — O98811 Other maternal infectious and parasitic diseases complicating pregnancy, first trimester: Secondary | ICD-10-CM | POA: Diagnosis present

## 2022-10-09 DIAGNOSIS — O98812 Other maternal infectious and parasitic diseases complicating pregnancy, second trimester: Secondary | ICD-10-CM

## 2022-10-09 DIAGNOSIS — O23591 Infection of other part of genital tract in pregnancy, first trimester: Secondary | ICD-10-CM | POA: Insufficient documentation

## 2022-10-09 DIAGNOSIS — A5901 Trichomonal vulvovaginitis: Secondary | ICD-10-CM

## 2022-10-09 DIAGNOSIS — O0990 Supervision of high risk pregnancy, unspecified, unspecified trimester: Secondary | ICD-10-CM

## 2022-10-09 DIAGNOSIS — R8271 Bacteriuria: Secondary | ICD-10-CM

## 2022-10-09 DIAGNOSIS — O0992 Supervision of high risk pregnancy, unspecified, second trimester: Secondary | ICD-10-CM

## 2022-10-09 DIAGNOSIS — Z3A14 14 weeks gestation of pregnancy: Secondary | ICD-10-CM

## 2022-10-09 DIAGNOSIS — Z2839 Other underimmunization status: Secondary | ICD-10-CM

## 2022-10-09 DIAGNOSIS — O23592 Infection of other part of genital tract in pregnancy, second trimester: Secondary | ICD-10-CM

## 2022-10-09 DIAGNOSIS — O34219 Maternal care for unspecified type scar from previous cesarean delivery: Secondary | ICD-10-CM

## 2022-10-09 NOTE — Progress Notes (Signed)
Subjective:   [name] Alvarez is a 27 y.o. [id number] at [redacted]w[redacted]d by early ultrasound being seen today for her first obstetrical visit.  Her obstetrical history is significant for  hx of cesarean x2 . Patient  unsure about  intend to breast feed. Pregnancy history fully reviewed.  Patient reports significant problems with nausea and vomiting, especially in the morning. Insurance is not active yet, so having trouble getting the medication filled.  HISTORY: OB History  Gravida Para Term Preterm AB Living  4 2 2  0 1 2  SAB IAB Ectopic Multiple Live Births  0 0 0 0 2    # Outcome Date GA Lbr Len/2nd Weight Sex Delivery Anes PTL Lv  4 Current           3 AB 01/2022          2 Term 11/13/14 [redacted]w[redacted]d  6 lb 13 oz (3.09 kg) F CS-LTranv Spinal  LIV     Birth Comments: repeat c/s     Apgar1: 8  Apgar5: 9  1 Term 02/15/12 [redacted]w[redacted]d   F CS-Unspec Spinal  LIV     Birth Comments: C/S due to [facility]     Last pap smear: Lab Results  Component Value Date   DIAGPAP  07/18/2020    - Negative for intraepithelial lesion or malignancy (NILM)   [place] Negative 07/18/2020   Up to date, can obtain at pp visit  History reviewed. No pertinent past medical history. Past Surgical History:  Procedure Laterality Date   [place] SECTION  2013   [place]   CESAREAN SECTION N/A 11/13/2014   Procedure: CESAREAN SECTION;  Surgeon: Mora Bellman, MD;  Location: [place] [facility];  Service: Obstetrics;  Laterality: N/A;   Family History  Problem Relation Age of Onset   Hypertension Father    Social History   Tobacco Use   Smoking status: Never   Smokeless tobacco: Never  Vaping Use   Vaping Use: Never used  Substance Use Topics   Alcohol use: Not Currently    Comment: occ   Drug use: No   No Known Allergies Current Outpatient Medications on File Prior to Visit  Medication Sig Dispense Refill   Blood Pressure Monitoring (BLOOD PRESSURE KIT) DEVI 1 Device by Does not apply route as needed. 1 each 0   fluconazole (DIFLUCAN)  150 MG tablet Take 1 tablet (150 mg total) by mouth Once PRN for up to 1 dose. Can take additional dose three days later if symptoms persist 1 tablet 1   metroNIDAZOLE (FLAGYL) 500 MG tablet Take 1 tablet (500 mg total) by mouth 2 (two) times daily. 14 tablet 0   ondansetron (ZOFRAN-ODT) 4 MG disintegrating tablet Take 1-2 tablets (4-8 mg total) by mouth every 8 (eight) hours as needed for nausea or vomiting. 60 tablet 3   Prenatal 27-1 MG TABS Take 1 tablet by mouth daily. 30 tablet 11   No current facility-administered medications on file prior to visit.     Exam   Vitals:   10/09/22 1011  BP: 125/86  Pulse: 84  Weight: 151 lb 3.2 oz (68.6 kg)   Fetal Heart Rate (bpm): 160 (by bedside ultrasound)  System: General: well-developed, well-nourished female in no acute distress   Skin: normal coloration and turgor, no rashes   Neurologic: oriented, normal, negative, normal mood   Extremities: normal strength, tone, and muscle mass, ROM of all joints is normal   HEENT PERRLA, extraocular movement intact and sclera clear, anicteric  Neck supple and no masses   Respiratory:  no respiratory distress      Assessment:   Pregnancy: [id number] Patient Active Problem List   Diagnosis Date Noted   Supervision of high risk pregnancy, antepartum 10/01/2022   Chlamydia infection affecting pregnancy in first trimester 09/02/2022   Trichomonal vaginitis during pregnancy, first trimester 09/02/2022   History of cesarean section complicating pregnancy 62/22/9798   Sickle cell trait ([place]) 09/11/2014   Rubella nonimmune 09/04/2014     Plan:  1. Supervision of high risk pregnancy, antepartum Given good rx coupoun for odt zofran, going to go to social services first to hopefully get medicaid sorted out BP and FHR normal Initial labs drawn. Continue prenatal vitamins. Genetic Screening discussed, NIPS: ordered. Ultrasound discussed; fetal anatomic survey: ordered. Problem list reviewed and  updated. The nature of [place] with multiple MDs and other Advanced Practice Providers was explained to patient; also emphasized that residents, students are part of our team.  2. Rubella nonimmune Offer MMR PP  3. History of cesarean section complicating pregnancy X2, discussed recommendation for RCS Not sure if she wants more kids after this  4. Trichomonal vaginitis during pregnancy, first trimester TOC collected today Reports she took full treatment  5. Chlamydia infection affecting pregnancy in first trimester TOC today   Routine obstetric precautions reviewed. Return for [facility], ob visit.

## 2022-10-10 LAB — CBC/D/PLT+RPR+RH+ABO+RUBIGG...
Antibody Screen: NEGATIVE
Basophils Absolute: 0 10*3/uL (ref 0.0–0.2)
Basos: 0 %
EOS (ABSOLUTE): 0.1 10*3/uL (ref 0.0–0.4)
Eos: 1 %
HCV Ab: NONREACTIVE
HIV Screen 4th Generation wRfx: NONREACTIVE
Hematocrit: 31.4 % — ABNORMAL LOW (ref 34.0–46.6)
Hemoglobin: 11 g/dL — ABNORMAL LOW (ref 11.1–15.9)
Hepatitis B Surface Ag: NEGATIVE
Immature Grans (Abs): 0 10*3/uL (ref 0.0–0.1)
Immature Granulocytes: 0 %
Lymphocytes Absolute: 1.9 10*3/uL (ref 0.7–3.1)
Lymphs: 30 %
MCH: 30.1 pg (ref 26.6–33.0)
MCHC: 35 g/dL (ref 31.5–35.7)
MCV: 86 fL (ref 79–97)
Monocytes Absolute: 0.4 10*3/uL (ref 0.1–0.9)
Monocytes: 7 %
Neutrophils Absolute: 3.8 10*3/uL (ref 1.4–7.0)
Neutrophils: 62 %
Platelets: 155 10*3/uL (ref 150–450)
RBC: 3.66 x10E6/uL — ABNORMAL LOW (ref 3.77–5.28)
RDW: 13.2 % (ref 11.7–15.4)
RPR Ser Ql: NONREACTIVE
Rh Factor: POSITIVE
Rubella Antibodies, IGG: <0.9 index — ABNORMAL LOW (ref >0.99)
WBC: 6.2 10*3/uL (ref 3.4–10.8)

## 2022-10-10 LAB — HEMOGLOBIN A1C
Est. average glucose Bld gHb Est-mCnc: 97 mg/dL
Hgb A1c MFr Bld: 5 % (ref 4.8–5.6)

## 2022-10-10 LAB — CERVICOVAGINAL ANCILLARY ONLY
Chlamydia: NEGATIVE
Comment: NEGATIVE
Comment: NEGATIVE
Comment: NORMAL
Neisseria Gonorrhea: NEGATIVE
Trichomonas: NEGATIVE

## 2022-10-10 LAB — HCV INTERPRETATION

## 2022-10-13 ENCOUNTER — Encounter: Payer: Self-pay | Admitting: *Deleted

## 2022-10-13 DIAGNOSIS — R8271 Bacteriuria: Secondary | ICD-10-CM | POA: Insufficient documentation

## 2022-10-13 DIAGNOSIS — O99891 Bacteriuria: Secondary | ICD-10-CM | POA: Insufficient documentation

## 2022-10-13 LAB — CULTURE, OB URINE

## 2022-10-13 LAB — URINE CULTURE, OB REFLEX

## 2022-10-13 MED ORDER — AMOXICILLIN-POT CLAVULANATE 875-125 MG PO TABS: 1.0000 | ORAL_TABLET | Freq: Two times a day (BID) | ORAL | 0 refills | Status: AC

## 2022-10-13 NOTE — Addendum Note (Signed)
Addended by: Clayton Lefort on: 10/13/2022 08:38 AM   Modules accepted: Orders

## 2022-10-17 LAB — PANORAMA PRENATAL TEST FULL PANEL:PANORAMA TEST PLUS 5 ADDITIONAL MICRODELETIONS: FETAL FRACTION: 20.3

## 2022-10-18 LAB — HORIZON CUSTOM: REPORT SUMMARY: POSITIVE — AB

## 2022-11-13 ENCOUNTER — Encounter: Payer: Self-pay | Admitting: *Deleted

## 2022-11-13 ENCOUNTER — Ambulatory Visit: Payer: Medicaid Other | Attending: Family Medicine

## 2022-11-13 ENCOUNTER — Ambulatory Visit: Payer: Medicaid Other | Admitting: *Deleted

## 2022-11-13 VITALS — BP 112/60 | HR 87

## 2022-11-13 DIAGNOSIS — D573 Sickle-cell trait: Secondary | ICD-10-CM | POA: Diagnosis not present

## 2022-11-13 DIAGNOSIS — O34219 Maternal care for unspecified type scar from previous cesarean delivery: Secondary | ICD-10-CM | POA: Diagnosis not present

## 2022-11-13 DIAGNOSIS — O0990 Supervision of high risk pregnancy, unspecified, unspecified trimester: Secondary | ICD-10-CM | POA: Insufficient documentation

## 2022-11-13 DIAGNOSIS — Z3A19 19 weeks gestation of pregnancy: Secondary | ICD-10-CM

## 2022-11-14 ENCOUNTER — Other Ambulatory Visit: Payer: Self-pay

## 2022-11-14 ENCOUNTER — Ambulatory Visit (INDEPENDENT_AMBULATORY_CARE_PROVIDER_SITE_OTHER): Payer: Medicaid Other | Admitting: Obstetrics & Gynecology

## 2022-11-14 VITALS — BP 125/84 | HR 97 | Wt 151.0 lb

## 2022-11-14 DIAGNOSIS — O0990 Supervision of high risk pregnancy, unspecified, unspecified trimester: Secondary | ICD-10-CM

## 2022-11-14 DIAGNOSIS — O0992 Supervision of high risk pregnancy, unspecified, second trimester: Secondary | ICD-10-CM

## 2022-11-14 DIAGNOSIS — O34219 Maternal care for unspecified type scar from previous cesarean delivery: Secondary | ICD-10-CM

## 2022-11-14 DIAGNOSIS — Z3A19 19 weeks gestation of pregnancy: Secondary | ICD-10-CM

## 2022-11-14 NOTE — Progress Notes (Signed)
PRENATAL VISIT NOTE  Subjective:  [name] Alvarez is a 27 y.o. [id number] at 24w3dbeing seen today for ongoing prenatal care.  She is currently monitored for the following issues for this high-risk pregnancy and has Rubella nonimmune; Sickle cell trait ([place]; History of two cesarean sections complicating pregnancy; Supervision of high risk pregnancy, antepartum; and Asymptomatic bacteriuria during pregnancy on their problem list.  Patient reports no complaints.  Contractions: Not present. Vag. Bleeding: None.  Movement: Present. Denies leaking of fluid.   The following portions of the patient's history were reviewed and updated as appropriate: allergies, current medications, past family history, past medical history, past social history, past surgical history and problem list.   Objective:   Vitals:   11/14/22 1104  BP: 125/84  Pulse: 97  Weight: 151 lb (68.5 kg)    Fetal Status: Fetal Heart Rate (bpm): 160   Movement: Present     General:  Alert, oriented and cooperative. Patient is in no acute distress.  Skin: Skin is warm and dry. No rash noted.   Cardiovascular: Normal heart rate noted  Respiratory: Normal respiratory effort, no problems with respiration noted  Abdomen: Soft, gravid, appropriate for gestational age.  Pain/Pressure: Absent     Pelvic: Cervical exam deferred        Extremities: Normal range of motion.  Edema: None  Mental Status: Normal mood and affect. Normal behavior. Normal judgment and thought content.   Imaging: UKoreaMFM OB DETAIL +14 WK  Result Date: 11/13/2022 ----------------------------------------------------------------------  OBSTETRICS REPORT                       (Signed Final 11/13/2022 10:25 am) ---------------------------------------------------------------------- Patient Info  ID #:       [id number]                         D.O.B.:  [birth year](26 yrs)  Name:       [name] Alvarez                  Visit Date: 11/13/2022 07:51 am  ---------------------------------------------------------------------- Performed By  Attending:        RTama HighMD        Ref. Address:     [address]                                                            [place] Performed By:     MJacob MooresBS,       Location:         [facility], [facility]                                                             [place] [facility]                                                             [facility]  Referred By:      [facility]                    for [facility] ---------------------------------------------------------------------- Orders  #  Description                           Code        Ordered By  1  [place] MFM OB DETAIL +14 [place]               [id number]    MATTHEW ECKSTAT ----------------------------------------------------------------------  #  Order #                     Accession #                Episode #  1  [id number]                   [id number]                 [id number] ---------------------------------------------------------------------- Indications  [redacted] weeks gestation of pregnancy                Z3A.19  Encounter for antenatal screening for          Z36.3  malformations  History of sickle cell trait                   Z86.2  History of cesarean delivery (x2), currently   O34.219  pregnant  LR NIPS ---------------------------------------------------------------------- Fetal Evaluation  Num Of Fetuses:         1  Fetal Heart Rate(bpm):  148  Cardiac Activity:       Observed  Presentation:           Cephalic  Placenta:               Posterior  P. Cord Insertion:      Visualized  Amniotic Fluid  AFI FV:      Within normal limits                              Largest Pocket(cm)                              3.28 ---------------------------------------------------------------------- Biometry  BPD:      42.2  mm     G. Age:  18w  5d         29  %    CI:        68.13   %    70 - 86  FL/HC:      18.3   %    16.1 - 18.3  HC:      163.5  mm     G. Age:  19w 1d         33  %    HC/AC:      1.20        1.09 - 1.39  AC:      136.1  mm     G. Age:  19w 0d         37  %    FL/BPD:     71.1   %  FL:         30  mm     G. Age:  19w 2d         42  %    FL/AC:      22.0   %    20 - 24  HUM:      26.7  mm     G. Age:  18w 3d         29  %  CER:      19.5  mm     G. Age:  19w 0d         35  %  NFT:       4.8  mm  LV:        6.3  mm  CM:        4.2  mm  Est. FW:     276  gm    0 lb 10 oz      37  % ---------------------------------------------------------------------- OB History  Blood Type:   B+  Gravidity:    4         Term:   2  TOP:          1        Living:  2 ---------------------------------------------------------------------- Gestational Age  LMP:           19w 2d        Date:  07/01/22                 EDD:   04/07/23  U/S Today:     19w 0d                                        EDD:   04/09/23  Best:          19w 2d     Det. By:  LMP  (07/01/22)          EDD:   04/07/23 ---------------------------------------------------------------------- Anatomy  Cranium:               Appears normal         LVOT:                   Appears normal  Cavum:                 Appears normal         Aortic Arch:            Appears normal  Ventricles:            Appears normal         Ductal Arch:            Appears normal  Choroid Plexus:  Appears normal         Diaphragm:              Appears normal  Cerebellum:            Appears normal         Stomach:                Appears normal, left                                                                        sided  Posterior Fossa:       Appears normal         Abdomen:                Appears normal  Nuchal Fold:           Appears normal         Abdominal Wall:         Appears nml (cord                                                                         insert, abd wall)  Face:                  Appears normal         Cord Vessels:           Appears normal (3                         (orbits and profile)                           vessel cord)  Lips:                  Appears normal         Kidneys:                Appear normal  Palate:                Not well visualized    Bladder:                Appears normal  Thoracic:              Appears normal         Spine:                  Appears normal  Heart:                 Appears normal         Upper Extremities:      Appears normal                         (4CH, axis, and  situs)  RVOT:                  Appears normal         Lower Extremities:      Appears normal  Other:  Fetus appears to be female. Heels/feet and open hands/5th digits          visualized. VC, 3VV and 3VTV visualized. Nasal bone, lenses,          maxilla, mandible and falx visualized ---------------------------------------------------------------------- Cervix Uterus Adnexa  Cervix  Length:              3  cm.  Normal appearance by transabdominal scan  Uterus  No abnormality visualized.  Right Ovary  Within normal limits.  Left Ovary  Within normal limits.  Cul De Sac  No free fluid seen.  Adnexa  No abnormality visualized ---------------------------------------------------------------------- Impression  G4 P2012. Patient is here for fetal anatomy scan. On cell-free  fetal DNA screening, the risks of aneuploidies are not  increased.  Obstetrical history significant for 2 term cesarean deliveries.  She reports no chronic medical conditions.  We performed a fetal anatomy scan. No markers of  aneuploidies or fetal structural defects are seen. Fetal  biometry is consistent with her previously-established dates.  Amniotic fluid is normal and good fetal activity is seen.  Placenta is posterior and there is no evidence of previa or  placenta accreta spectrum.  Patient understands the limitations of ultrasound in detecting  fetal  anomalies.  Patient has sickle cell trait and her partner carrier status is  not known.  I discussed genetics and the likelihood of the  fetus being affected with sickle cell disease if her partner  were a carrier (1 and 4).  I encourage partner screening.  Alternatively, prenatal diagnosis is possible by amniocentesis.  Patient will discuss with her partner and decide. ---------------------------------------------------------------------- Recommendations  Follow-up scans as clinically indicated. ----------------------------------------------------------------------                 Tama High, MD Electronically Signed Final Report   11/13/2022 10:25 am ----------------------------------------------------------------------   Assessment and Plan:  Pregnancy: [id number] at 70w3d1. History of two cesarean sections complicating pregnancy Patient desires TOLAC, briefly discussed risks of this with her. Gave her information to review at home, she will make decision by next visit and sign consent.   2. [redacted] weeks gestation of pregnancy 3. Supervision of high risk pregnancy, antepartum LR NIPS, normal anatomy scan. Preterm labor symptoms and general obstetric precautions including but not limited to vaginal bleeding, contractions, leaking of fluid and fetal movement were reviewed in detail with the patient. Please refer to After Visit Summary for other counseling recommendations.   Return in about 4 weeks (around 12/12/2022) for OFFICE OB VISIT (MD or APP).  Future Appointments  Date Time Provider Department Center  12/11/2022  9:35 AM [facility] [facility]   UVerita Schneiders MD

## 2022-12-11 ENCOUNTER — Ambulatory Visit (INDEPENDENT_AMBULATORY_CARE_PROVIDER_SITE_OTHER): Payer: Medicaid Other | Admitting: Advanced Practice Midwife

## 2022-12-11 ENCOUNTER — Other Ambulatory Visit: Payer: Self-pay

## 2022-12-11 VITALS — BP 110/71 | HR 90 | Wt 153.9 lb

## 2022-12-11 DIAGNOSIS — D573 Sickle-cell trait: Secondary | ICD-10-CM

## 2022-12-11 DIAGNOSIS — O0992 Supervision of high risk pregnancy, unspecified, second trimester: Secondary | ICD-10-CM

## 2022-12-11 DIAGNOSIS — O99892 Other specified diseases and conditions complicating childbirth: Secondary | ICD-10-CM

## 2022-12-11 DIAGNOSIS — Z3A23 23 weeks gestation of pregnancy: Secondary | ICD-10-CM

## 2022-12-11 DIAGNOSIS — O99891 Other specified diseases and conditions complicating pregnancy: Secondary | ICD-10-CM

## 2022-12-11 DIAGNOSIS — R8271 Bacteriuria: Secondary | ICD-10-CM

## 2022-12-11 DIAGNOSIS — O34219 Maternal care for unspecified type scar from previous cesarean delivery: Secondary | ICD-10-CM

## 2022-12-11 DIAGNOSIS — O0990 Supervision of high risk pregnancy, unspecified, unspecified trimester: Secondary | ICD-10-CM

## 2022-12-11 DIAGNOSIS — Z2839 Other underimmunization status: Secondary | ICD-10-CM

## 2022-12-11 NOTE — Progress Notes (Signed)
   PRENATAL VISIT NOTE  Subjective:  [name] Alvarez is a 27 y.o. [id number] at [redacted]w[redacted]d being seen today for ongoing prenatal care.  She is currently monitored for the following issues for this low-risk pregnancy and has Rubella nonimmune; Sickle cell trait ([place]); History of two cesarean sections complicating pregnancy; Supervision of high risk pregnancy, antepartum; and Asymptomatic bacteriuria during pregnancy on their problem list.  Patient reports no complaints.  Contractions: Not present. Vag. Bleeding: None.  Movement: Present. Denies leaking of fluid.   The following portions of the patient's history were reviewed and updated as appropriate: allergies, current medications, past family history, past medical history, past social history, past surgical history and problem list.   Objective:   Vitals:   12/11/22 0948  BP: 110/71  Pulse: 90  Weight: 153 lb 14.4 oz (69.8 kg)    Fetal Status: Fetal Heart Rate (bpm): 140 Fundal Height: 24 cm Movement: Present     General:  Alert, oriented and cooperative. Patient is in no acute distress.  Skin: Skin is warm and dry. No rash noted.   Cardiovascular: Normal heart rate noted  Respiratory: Normal respiratory effort, no problems with respiration noted  Abdomen: Soft, gravid, appropriate for gestational age.  Pain/Pressure: Present     Pelvic: Cervical exam deferred        Extremities: Normal range of motion.  Edema: Trace  Mental Status: Normal mood and affect. Normal behavior. Normal judgment and thought content.   Assessment and Plan:  Pregnancy: [id number] at [redacted]w[redacted]d 1. Asymptomatic bacteriuria during pregnancy - TOC  2. History of two cesarean sections complicating pregnancy (G1: fetal indications, FGR, G2: planned repeat) - See MD NV for TOLAC consent  3. Rubella nonimmune - MMR PP  4. Sickle cell trait ([place]) - Offer partner kit NV  5. Supervision of high risk pregnancy, antepartum - Routine care - GTT NV  6. [redacted] weeks gestation of  pregnancy   Preterm labor symptoms and general obstetric precautions including but not limited to vaginal bleeding, contractions, leaking of fluid and fetal movement were reviewed in detail with the patient. Please refer to After Visit Summary for other counseling recommendations.   No follow-ups on file.  Future Appointments  Date Time Provider [place]  01/08/2023  8:20 AM [facility] [address] [facility]  01/08/2023  8:55 AM Donnamae Jude, MD [facility] [place], CNM

## 2022-12-13 LAB — CULTURE, OB URINE

## 2022-12-13 LAB — URINE CULTURE, OB REFLEX

## 2023-01-02 ENCOUNTER — Other Ambulatory Visit: Payer: Self-pay

## 2023-01-02 DIAGNOSIS — O0990 Supervision of high risk pregnancy, unspecified, unspecified trimester: Secondary | ICD-10-CM

## 2023-01-08 ENCOUNTER — Encounter: Payer: Self-pay | Admitting: Family Medicine

## 2023-01-08 ENCOUNTER — Ambulatory Visit (INDEPENDENT_AMBULATORY_CARE_PROVIDER_SITE_OTHER): Payer: Medicaid Other | Admitting: Family Medicine

## 2023-01-08 ENCOUNTER — Other Ambulatory Visit: Payer: Medicaid Other

## 2023-01-08 ENCOUNTER — Other Ambulatory Visit: Payer: Self-pay

## 2023-01-08 VITALS — BP 111/75 | HR 62 | Wt 160.5 lb

## 2023-01-08 DIAGNOSIS — O34219 Maternal care for unspecified type scar from previous cesarean delivery: Secondary | ICD-10-CM

## 2023-01-08 DIAGNOSIS — O0992 Supervision of high risk pregnancy, unspecified, second trimester: Secondary | ICD-10-CM

## 2023-01-08 DIAGNOSIS — O0990 Supervision of high risk pregnancy, unspecified, unspecified trimester: Secondary | ICD-10-CM

## 2023-01-08 DIAGNOSIS — Z2839 Other underimmunization status: Secondary | ICD-10-CM

## 2023-01-08 DIAGNOSIS — O99892 Other specified diseases and conditions complicating childbirth: Secondary | ICD-10-CM

## 2023-01-08 DIAGNOSIS — Z3A27 27 weeks gestation of pregnancy: Secondary | ICD-10-CM

## 2023-01-08 DIAGNOSIS — Z23 Encounter for immunization: Secondary | ICD-10-CM

## 2023-01-08 NOTE — Progress Notes (Signed)
   PRENATAL VISIT NOTE  Subjective:  [name] Alvarez is a 27 y.o. [id number] at [redacted]w[redacted]d being seen today for ongoing prenatal care.  She is currently monitored for the following issues for this low-risk pregnancy and has Rubella nonimmune; Sickle cell trait; History of two cesarean sections complicating pregnancy; Chlamydia infection affecting pregnancy in first trimester; Trichomonal vaginitis during pregnancy, first trimester; Supervision of high risk pregnancy, antepartum; and Asymptomatic bacteriuria during pregnancy on their problem list.  Patient reports no complaints.  Contractions: Not present. Vag. Bleeding: None.  Movement: Present. Denies leaking of fluid.   The following portions of the patient's history were reviewed and updated as appropriate: allergies, current medications, past family history, past medical history, past social history, past surgical history and problem list.   Objective:   Vitals:   01/08/23 0903  BP: 111/75  Pulse: 62  Weight: 160 lb 8 oz (72.8 kg)    Fetal Status: Fetal Heart Rate (bpm): 156   Movement: Present     General:  Alert, oriented and cooperative. Patient is in no acute distress.  Skin: Skin is warm and dry. No rash noted.   Cardiovascular: Normal heart rate noted  Respiratory: Normal respiratory effort, no problems with respiration noted  Abdomen: Soft, gravid, appropriate for gestational age.  Pain/Pressure: Absent     Pelvic: Cervical exam deferred        Extremities: Normal range of motion.  Edema: Trace  Mental Status: Normal mood and affect. Normal behavior. Normal judgment and thought content.   Assessment and Plan:  Pregnancy: [name] at [redacted]w[redacted]d 1. Supervision of high risk pregnancy, antepartum Continue routine prenatal care.  2. Rubella nonimmune MMR pp  3. History of two cesarean sections complicating pregnancy Desires VBAC--reviewed R/B/A and risks with 2 prior c-sections. She strongly desires this. Consent signed  Preterm labor  symptoms and general obstetric precautions including but not limited to vaginal bleeding, contractions, leaking of fluid and fetal movement were reviewed in detail with the patient. Please refer to After Visit Summary for other counseling recommendations.   Return in 2 weeks (on 01/22/2023) for [facility].  Future Appointments  Date Time Provider Department Center  01/22/2023  1:15 PM [place] Bing, MD [facility]    Reva Bores, MD

## 2023-01-09 ENCOUNTER — Encounter: Payer: Self-pay | Admitting: Family Medicine

## 2023-01-09 ENCOUNTER — Telehealth: Payer: Self-pay | Admitting: General Practice

## 2023-01-09 DIAGNOSIS — O24419 Gestational diabetes mellitus in pregnancy, unspecified control: Secondary | ICD-10-CM | POA: Insufficient documentation

## 2023-01-09 DIAGNOSIS — O24410 Gestational diabetes mellitus in pregnancy, diet controlled: Secondary | ICD-10-CM

## 2023-01-09 LAB — CBC
Hematocrit: 30.8 % — ABNORMAL LOW (ref 34.0–46.6)
Hemoglobin: 10.3 g/dL — ABNORMAL LOW (ref 11.1–15.9)
MCH: 29.5 pg (ref 26.6–33.0)
MCHC: 33.4 g/dL (ref 31.5–35.7)
MCV: 88 fL (ref 79–97)
Platelets: 154 10*3/uL (ref 150–450)
RBC: 3.49 x10E6/uL — ABNORMAL LOW (ref 3.77–5.28)
RDW: 13.2 % (ref 11.7–15.4)
WBC: 6.1 10*3/uL (ref 3.4–10.8)

## 2023-01-09 LAB — RPR: RPR Ser Ql: NONREACTIVE

## 2023-01-09 LAB — GLUCOSE TOLERANCE, 2 HOURS W/ 1HR
Glucose, 1 hour: 208 mg/dL — ABNORMAL HIGH (ref 70–179)
Glucose, 2 hour: 198 mg/dL — ABNORMAL HIGH (ref 70–152)
Glucose, Fasting: 129 mg/dL — ABNORMAL HIGH (ref 70–91)

## 2023-01-09 LAB — HIV ANTIBODY (ROUTINE TESTING W REFLEX): HIV Screen 4th Generation wRfx: NONREACTIVE

## 2023-01-09 MED ORDER — ACCU-CHEK SOFTCLIX LANCETS MISC: 12 refills | Status: DC

## 2023-01-09 MED ORDER — ACCU-CHEK GUIDE VI STRP: ORAL_STRIP | 12 refills | Status: DC

## 2023-01-09 MED ORDER — ACCU-CHEK GUIDE W/DEVICE KIT: 1.0000 | PACK | Freq: Once | 0 refills | Status: AC

## 2023-01-09 NOTE — Telephone Encounter (Signed)
Testing supplies ordered and referral placed [facility]. Attempted to call several times to schedule patient in the class, no answer. Will try later.

## 2023-01-09 NOTE — Telephone Encounter (Signed)
-----   Message from Reva Bores, MD sent at 01/09/2023 10:08 AM EDT ----- Has GDM--needs teaching and supplies--please arrange ASAP

## 2023-01-12 NOTE — Telephone Encounter (Signed)
Per chart review, patient is scheduled with diabetes class for 5/1. Called patient who states she is aware of appt. Discussed testing supplies sent to pharmacy, to pick those up and bring to her class. Patient verbalized understanding and asked if she would be coming into the office more often. Told patient likely still every 2 weeks but we will have a better idea when she sees the doctor next week. Patient verbalized understanding.

## 2023-01-14 ENCOUNTER — Ambulatory Visit: Payer: Medicaid Other

## 2023-01-21 ENCOUNTER — Encounter: Payer: Medicaid Other | Attending: Family Medicine | Admitting: Registered"

## 2023-01-21 ENCOUNTER — Encounter: Payer: Self-pay | Admitting: Registered"

## 2023-01-21 DIAGNOSIS — O24419 Gestational diabetes mellitus in pregnancy, unspecified control: Secondary | ICD-10-CM | POA: Diagnosis present

## 2023-01-21 NOTE — Progress Notes (Unsigned)
The following learning objectives were met by the patient during this course:   States the definition of Gestational Diabetes States why dietary management is important in controlling blood glucose Describes the effects each nutrient has on blood glucose levels Demonstrates ability to create a balanced meal plan Demonstrates carbohydrate counting  States when to check blood glucose levels Demonstrates proper blood glucose monitoring techniques States the effect of stress and exercise on blood glucose levels States the importance of limiting caffeine and abstaining from alcohol and smoking   Blood glucose monitor given: None. Patient has meter and checking blood sugar prior to class.   Patient instructed to monitor glucose levels: FBS: 60 - ? 95 mg/dL; 1 hr: <140 OR 2 hr: <120   Patient received handouts: Nutrition Diabetes and Pregnancy, including carb counting list and glucose log sheet   Patient will be seen for follow-up as needed. 

## 2023-01-22 ENCOUNTER — Other Ambulatory Visit: Payer: Self-pay

## 2023-01-22 ENCOUNTER — Ambulatory Visit (INDEPENDENT_AMBULATORY_CARE_PROVIDER_SITE_OTHER): Payer: Medicaid Other | Admitting: Obstetrics and Gynecology

## 2023-01-22 VITALS — BP 124/59 | HR 102 | Wt 161.2 lb

## 2023-01-22 DIAGNOSIS — O99891 Other specified diseases and conditions complicating pregnancy: Secondary | ICD-10-CM

## 2023-01-22 DIAGNOSIS — O0993 Supervision of high risk pregnancy, unspecified, third trimester: Secondary | ICD-10-CM

## 2023-01-22 DIAGNOSIS — R8271 Bacteriuria: Secondary | ICD-10-CM

## 2023-01-22 DIAGNOSIS — A5901 Trichomonal vulvovaginitis: Secondary | ICD-10-CM

## 2023-01-22 DIAGNOSIS — O34219 Maternal care for unspecified type scar from previous cesarean delivery: Secondary | ICD-10-CM

## 2023-01-22 DIAGNOSIS — A749 Chlamydial infection, unspecified: Secondary | ICD-10-CM

## 2023-01-22 DIAGNOSIS — Z3A29 29 weeks gestation of pregnancy: Secondary | ICD-10-CM

## 2023-01-22 DIAGNOSIS — O24419 Gestational diabetes mellitus in pregnancy, unspecified control: Secondary | ICD-10-CM

## 2023-01-22 DIAGNOSIS — O98813 Other maternal infectious and parasitic diseases complicating pregnancy, third trimester: Secondary | ICD-10-CM

## 2023-01-22 DIAGNOSIS — O0990 Supervision of high risk pregnancy, unspecified, unspecified trimester: Secondary | ICD-10-CM

## 2023-01-22 DIAGNOSIS — O23593 Infection of other part of genital tract in pregnancy, third trimester: Secondary | ICD-10-CM

## 2023-01-22 DIAGNOSIS — O99893 Other specified diseases and conditions complicating puerperium: Secondary | ICD-10-CM

## 2023-01-22 MED ORDER — BLOOD PRESSURE KIT DEVI: 1.0000 | 0 refills | Status: AC | PRN

## 2023-01-22 NOTE — Progress Notes (Signed)
   PRENATAL VISIT NOTE  Subjective:  [name] Alvarez is a 27 y.o. [id number] at [redacted]w[redacted]d being seen today for ongoing prenatal care.  She is currently monitored for the following issues for this high-risk pregnancy and has Rubella nonimmune; Sickle cell trait (HCC); History of two cesarean sections complicating pregnancy; Chlamydia infection affecting pregnancy in first trimester; Trichomonal vaginitis during pregnancy, first trimester; Supervision of high risk pregnancy, antepartum; Asymptomatic bacteriuria during pregnancy; and Gestational diabetes on their problem list.  Patient reports no complaints.  Contractions: Not present. Vag. Bleeding: None.  Movement: Present. Denies leaking of fluid.   The following portions of the patient's history were reviewed and updated as appropriate: allergies, current medications, past family history, past medical history, past social history, past surgical history and problem list.   Objective:   Vitals:   01/22/23 1328  BP: (!) 124/59  Pulse: (!) 102  Weight: 161 lb 3.2 oz (73.1 kg)    Fetal Status: Fetal Heart Rate (bpm): 145 Fundal Height: 28 cm Movement: Present     General:  Alert, oriented and cooperative. Patient is in no acute distress.  Skin: Skin is warm and dry. No rash noted.   Cardiovascular: Normal heart rate noted  Respiratory: Normal respiratory effort, no problems with respiration noted  Abdomen: Soft, gravid, appropriate for gestational age.  Pain/Pressure: Present (pain in back and bottom)     Pelvic: Cervical exam deferred        Extremities: Normal range of motion.  Edema: Trace  Mental Status: Normal mood and affect. Normal behavior. Normal judgment and thought content.   Assessment and Plan:  Pregnancy: [id number] at [redacted]w[redacted]d 1. Supervision of high risk pregnancy, antepartum Unsure about birth control - Blood Pressure Monitoring (BLOOD PRESSURE KIT) DEVI; 1 Device by Does not apply route as needed.  Dispense: 1 each; Refill: 0  2.  Asymptomatic bacteriuria during pregnancy Ucx neg 3/28  3. Chlamydia infection affecting pregnancy in first trimester Toc neg  4. Gestational diabetes mellitus (GDM) in third trimester, gestational diabetes method of control unspecified Had first visit with dm education yesterday. D/w her re: DM in pregnancy. F/u CBGs in 10-14d. Pt told to let us know if really elevated  5. History of two cesarean sections complicating pregnancy Desires tolac, consent signed on 4/25 1st in [place] falls and states she presented with UCs and NRFHTs and they recommended a c/s; she states she wasn't pushing 2nd was elective repeat in 2016  6. Trichomonal vaginitis during pregnancy, first trimester Neg toc.   Preterm labor symptoms and general obstetric precautions including but not limited to vaginal bleeding, contractions, leaking of fluid and fetal movement were reviewed in detail with the patient. Please refer to After Visit Summary for other counseling recommendations.   Return in about 10 days (around 02/01/2023) for in person, md or app, high risk ob.  No future appointments.  [place] Bing, MD

## 2023-01-28 ENCOUNTER — Encounter: Payer: Self-pay | Admitting: Family Medicine

## 2023-01-29 ENCOUNTER — Other Ambulatory Visit: Payer: Self-pay | Admitting: Lactation Services

## 2023-01-29 MED ORDER — TERCONAZOLE 0.4 % VA CREA: 1.0000 | TOPICAL_CREAM | Freq: Every day | VAGINAL | 0 refills | Status: DC

## 2023-01-29 NOTE — Progress Notes (Signed)
Patient c/o vaginal itching and irritation and white discharge. Terazol ordered per standing protocol.

## 2023-02-02 ENCOUNTER — Other Ambulatory Visit: Payer: Self-pay | Admitting: Family Medicine

## 2023-02-02 DIAGNOSIS — Z3201 Encounter for pregnancy test, result positive: Secondary | ICD-10-CM

## 2023-02-03 ENCOUNTER — Other Ambulatory Visit: Payer: Self-pay | Admitting: Family Medicine

## 2023-02-03 DIAGNOSIS — Z3201 Encounter for pregnancy test, result positive: Secondary | ICD-10-CM

## 2023-02-05 ENCOUNTER — Other Ambulatory Visit: Payer: Self-pay

## 2023-02-05 ENCOUNTER — Ambulatory Visit (INDEPENDENT_AMBULATORY_CARE_PROVIDER_SITE_OTHER): Payer: Medicaid Other | Admitting: Family Medicine

## 2023-02-05 VITALS — BP 113/72 | HR 93 | Wt 161.0 lb

## 2023-02-05 DIAGNOSIS — D573 Sickle-cell trait: Secondary | ICD-10-CM

## 2023-02-05 DIAGNOSIS — O0993 Supervision of high risk pregnancy, unspecified, third trimester: Secondary | ICD-10-CM

## 2023-02-05 DIAGNOSIS — O23593 Infection of other part of genital tract in pregnancy, third trimester: Secondary | ICD-10-CM

## 2023-02-05 DIAGNOSIS — Z3A31 31 weeks gestation of pregnancy: Secondary | ICD-10-CM

## 2023-02-05 DIAGNOSIS — A749 Chlamydial infection, unspecified: Secondary | ICD-10-CM

## 2023-02-05 DIAGNOSIS — O24419 Gestational diabetes mellitus in pregnancy, unspecified control: Secondary | ICD-10-CM

## 2023-02-05 DIAGNOSIS — A5901 Trichomonal vulvovaginitis: Secondary | ICD-10-CM

## 2023-02-05 DIAGNOSIS — Z2839 Other underimmunization status: Secondary | ICD-10-CM

## 2023-02-05 DIAGNOSIS — O0990 Supervision of high risk pregnancy, unspecified, unspecified trimester: Secondary | ICD-10-CM

## 2023-02-05 DIAGNOSIS — O34219 Maternal care for unspecified type scar from previous cesarean delivery: Secondary | ICD-10-CM

## 2023-02-05 DIAGNOSIS — O99892 Other specified diseases and conditions complicating childbirth: Secondary | ICD-10-CM

## 2023-02-05 DIAGNOSIS — O98813 Other maternal infectious and parasitic diseases complicating pregnancy, third trimester: Secondary | ICD-10-CM

## 2023-02-05 MED ORDER — METFORMIN HCL 500 MG PO TABS
500.0000 mg | ORAL_TABLET | Freq: Two times a day (BID) | ORAL | 5 refills | Status: DC
Start: 2023-02-05 — End: 2023-03-12

## 2023-02-05 NOTE — Progress Notes (Signed)
   Subjective:  [name] Alvarez is a 27 y.o. [id number] at [redacted]w[redacted]d being seen today for ongoing prenatal care.  She is currently monitored for the following issues for this high-risk pregnancy and has Rubella nonimmune; Sickle cell trait (HCC); History of two cesarean sections complicating pregnancy; Chlamydia infection affecting pregnancy in first trimester; Trichomonal vaginitis during pregnancy, first trimester; Supervision of high risk pregnancy, antepartum; and Gestational diabetes on their problem list.  Patient reports no complaints.  Contractions: Not present. Vag. Bleeding: None.  Movement: Present. Denies leaking of fluid.   The following portions of the patient's history were reviewed and updated as appropriate: allergies, current medications, past family history, past medical history, past social history, past surgical history and problem list. Problem list updated.  Objective:   Vitals:   02/05/23 1636  BP: 113/72  Pulse: 93  Weight: 161 lb (73 kg)    Fetal Status: Fetal Heart Rate (bpm): 154   Movement: Present     General:  Alert, oriented and cooperative. Patient is in no acute distress.  Skin: Skin is warm and dry. No rash noted.   Cardiovascular: Normal heart rate noted  Respiratory: Normal respiratory effort, no problems with respiration noted  Abdomen: Soft, gravid, appropriate for gestational age. Pain/Pressure: Absent     Pelvic: Vag. Bleeding: None     Cervical exam deferred        Extremities: Normal range of motion.  Edema: Trace  Mental Status: Normal mood and affect. Normal behavior. Normal judgment and thought content.   Urinalysis:      Assessment and Plan:  Pregnancy: [id number] at [redacted]w[redacted]d  1. Supervision of high risk pregnancy, antepartum BP and FHR normal  2. Gestational diabetes mellitus (GDM) in third trimester, gestational diabetes method of control unspecified Does not have log Fastings are 28-98, postprandials 130-170's Start metformin 500 mg BID,  urged to follow GDM diet more closely No growth [place] since anatomy scan, will schedule follow up ASAP   3. Trichomonal vaginitis during pregnancy, first trimester Negative test at new OB visit, resolved from problem list  4. Rubella nonimmune Offer MMR PP  5. History of two cesarean sections complicating pregnancy Desires TOLAC, consent signed 01/08/2023  6. Chlamydia infection affecting pregnancy in first trimester Negative test at new OB visit, resolved from problem list  7. Sickle cell trait (HCC) Will discuss partner testing with her at next visit  Preterm labor symptoms and general obstetric precautions including but not limited to vaginal bleeding, contractions, leaking of fluid and fetal movement were reviewed in detail with the patient. Please refer to After Visit Summary for other counseling recommendations.  Return in 2 weeks (on 02/19/2023).   Venora Maples, MD

## 2023-02-05 NOTE — Patient Instructions (Signed)

## 2023-02-10 ENCOUNTER — Other Ambulatory Visit: Payer: Self-pay

## 2023-02-10 DIAGNOSIS — Z3201 Encounter for pregnancy test, result positive: Secondary | ICD-10-CM

## 2023-02-10 MED ORDER — PRENATAL 27-1 MG PO TABS
1.0000 | ORAL_TABLET | Freq: Every day | ORAL | 11 refills | Status: AC
Start: 2023-02-10 — End: ?

## 2023-02-19 ENCOUNTER — Ambulatory Visit (INDEPENDENT_AMBULATORY_CARE_PROVIDER_SITE_OTHER): Payer: Medicaid Other | Admitting: Obstetrics & Gynecology

## 2023-02-19 ENCOUNTER — Other Ambulatory Visit: Payer: Self-pay

## 2023-02-19 VITALS — BP 116/76 | HR 99 | Wt 165.9 lb

## 2023-02-19 DIAGNOSIS — O34219 Maternal care for unspecified type scar from previous cesarean delivery: Secondary | ICD-10-CM

## 2023-02-19 DIAGNOSIS — Z3A33 33 weeks gestation of pregnancy: Secondary | ICD-10-CM

## 2023-02-19 DIAGNOSIS — O0993 Supervision of high risk pregnancy, unspecified, third trimester: Secondary | ICD-10-CM

## 2023-02-19 DIAGNOSIS — O0990 Supervision of high risk pregnancy, unspecified, unspecified trimester: Secondary | ICD-10-CM

## 2023-02-19 DIAGNOSIS — O24419 Gestational diabetes mellitus in pregnancy, unspecified control: Secondary | ICD-10-CM

## 2023-02-19 NOTE — Progress Notes (Signed)
   PRENATAL VISIT NOTE  Subjective:  [name] Alvarez is a 27 y.o. [id number] at [redacted]w[redacted]d being seen today for ongoing prenatal care.  She is currently monitored for the following issues for this high-risk pregnancy and has Rubella nonimmune; Sickle cell trait (HCC); History of two cesarean sections complicating pregnancy; Supervision of high risk pregnancy, antepartum; and Gestational diabetes on their problem list.  Patient reports  doesn't like testing her BG due to pain .  Contractions: Not present. Vag. Bleeding: None.  Movement: Present. Denies leaking of fluid.   The following portions of the patient's history were reviewed and updated as appropriate: allergies, current medications, past family history, past medical history, past social history, past surgical history and problem list.   Objective:   Vitals:   02/19/23 1635  BP: 116/76  Pulse: 99  Weight: 165 lb 14.4 oz (75.3 kg)    Fetal Status: Fetal Heart Rate (bpm): 168   Movement: Present     General:  Alert, oriented and cooperative. Patient is in no acute distress.  Skin: Skin is warm and dry. No rash noted.   Cardiovascular: Normal heart rate noted  Respiratory: Normal respiratory effort, no problems with respiration noted  Abdomen: Soft, gravid, appropriate for gestational age.  Pain/Pressure: Present     Pelvic: Cervical exam deferred        Extremities: Normal range of motion.  Edema: Trace  Mental Status: Normal mood and affect. Normal behavior. Normal judgment and thought content.   Assessment and Plan:  Pregnancy: [name] at [redacted]w[redacted]d 1. Gestational diabetes mellitus (GDM) in third trimester, gestational diabetes method of control unspecified BG up to 177-188, but testing very sporadically  - Ambulatory referral to Nutrition and Diabetic Education  2. History of two cesarean sections complicating pregnancy Hopes to have VBAC  3. Supervision of high risk pregnancy, antepartum   Preterm labor symptoms and general  obstetric precautions including but not limited to vaginal bleeding, contractions, leaking of fluid and fetal movement were reviewed in detail with the patient. Please refer to After Visit Summary for other counseling recommendations.   Return in about 2 weeks (around 03/05/2023).  Future Appointments  Date Time Provider Department Center  02/26/2023 10:30 AM [facility] NURSE [facility]  02/26/2023 10:45 AM [facility] US7 [facility]  03/12/2023  3:55 PM Ramsey Bing, MD [facility]    Scheryl Darter, MD

## 2023-02-19 NOTE — Progress Notes (Signed)
Pt has Glucose log on phone. 

## 2023-02-26 ENCOUNTER — Other Ambulatory Visit: Payer: Self-pay | Admitting: Family Medicine

## 2023-02-26 ENCOUNTER — Other Ambulatory Visit: Payer: Self-pay | Admitting: *Deleted

## 2023-02-26 ENCOUNTER — Ambulatory Visit: Payer: Medicaid Other | Attending: Family Medicine

## 2023-02-26 ENCOUNTER — Ambulatory Visit: Payer: Medicaid Other | Admitting: *Deleted

## 2023-02-26 VITALS — BP 122/62 | HR 94

## 2023-02-26 DIAGNOSIS — Z3A34 34 weeks gestation of pregnancy: Secondary | ICD-10-CM | POA: Diagnosis not present

## 2023-02-26 DIAGNOSIS — O34219 Maternal care for unspecified type scar from previous cesarean delivery: Secondary | ICD-10-CM | POA: Diagnosis not present

## 2023-02-26 DIAGNOSIS — O0990 Supervision of high risk pregnancy, unspecified, unspecified trimester: Secondary | ICD-10-CM | POA: Insufficient documentation

## 2023-02-26 DIAGNOSIS — O24419 Gestational diabetes mellitus in pregnancy, unspecified control: Secondary | ICD-10-CM | POA: Diagnosis present

## 2023-02-26 DIAGNOSIS — O24415 Gestational diabetes mellitus in pregnancy, controlled by oral hypoglycemic drugs: Secondary | ICD-10-CM

## 2023-03-05 ENCOUNTER — Ambulatory Visit: Payer: Medicaid Other | Admitting: *Deleted

## 2023-03-05 ENCOUNTER — Encounter: Payer: Self-pay | Admitting: Family Medicine

## 2023-03-05 ENCOUNTER — Ambulatory Visit: Payer: Medicaid Other | Attending: Obstetrics and Gynecology | Admitting: *Deleted

## 2023-03-05 VITALS — BP 109/57 | HR 94

## 2023-03-05 DIAGNOSIS — O24419 Gestational diabetes mellitus in pregnancy, unspecified control: Secondary | ICD-10-CM | POA: Diagnosis present

## 2023-03-05 DIAGNOSIS — O24415 Gestational diabetes mellitus in pregnancy, controlled by oral hypoglycemic drugs: Secondary | ICD-10-CM | POA: Diagnosis not present

## 2023-03-05 DIAGNOSIS — Z3A35 35 weeks gestation of pregnancy: Secondary | ICD-10-CM

## 2023-03-05 DIAGNOSIS — Z3A33 33 weeks gestation of pregnancy: Secondary | ICD-10-CM | POA: Insufficient documentation

## 2023-03-05 DIAGNOSIS — O0990 Supervision of high risk pregnancy, unspecified, unspecified trimester: Secondary | ICD-10-CM

## 2023-03-05 NOTE — Procedures (Signed)
[name] Alvarez 1996-01-11 [redacted]w[redacted]d  Fetus A Non-Stress Test Interpretation for 03/05/23-NST only  Indication: Gestational Diabetes medication controlled  Fetal Heart Rate A Mode: External Baseline Rate (A): 140 bpm Variability: Moderate Accelerations: 15 x 15 Decelerations: None Multiple birth?: No  Uterine Activity Mode: Toco Contraction Frequency (min): none Resting Tone Palpated: Relaxed  Interpretation (Fetal Testing) Nonstress Test Interpretation: Reactive Overall Impression: Reassuring for gestational age Comments: Tracing reviewed byDr. Judeth Cornfield

## 2023-03-12 ENCOUNTER — Other Ambulatory Visit: Payer: Self-pay

## 2023-03-12 ENCOUNTER — Ambulatory Visit: Payer: Medicaid Other | Admitting: *Deleted

## 2023-03-12 ENCOUNTER — Ambulatory Visit: Payer: Medicaid Other | Attending: Obstetrics and Gynecology | Admitting: *Deleted

## 2023-03-12 ENCOUNTER — Ambulatory Visit (INDEPENDENT_AMBULATORY_CARE_PROVIDER_SITE_OTHER): Payer: Medicaid Other | Admitting: Obstetrics and Gynecology

## 2023-03-12 ENCOUNTER — Other Ambulatory Visit (HOSPITAL_COMMUNITY)
Admission: RE | Admit: 2023-03-12 | Discharge: 2023-03-12 | Disposition: A | Discharge status: Home / Self Care | Payer: Medicaid Other | Source: Ambulatory Visit | Attending: Obstetrics and Gynecology | Admitting: Obstetrics and Gynecology

## 2023-03-12 ENCOUNTER — Other Ambulatory Visit (INDEPENDENT_AMBULATORY_CARE_PROVIDER_SITE_OTHER): Payer: Medicaid Other

## 2023-03-12 VITALS — BP 129/72 | HR 92

## 2023-03-12 VITALS — BP 111/85 | HR 84 | Wt 171.0 lb

## 2023-03-12 DIAGNOSIS — O24415 Gestational diabetes mellitus in pregnancy, controlled by oral hypoglycemic drugs: Secondary | ICD-10-CM

## 2023-03-12 DIAGNOSIS — Z3A36 36 weeks gestation of pregnancy: Secondary | ICD-10-CM

## 2023-03-12 DIAGNOSIS — O0990 Supervision of high risk pregnancy, unspecified, unspecified trimester: Secondary | ICD-10-CM | POA: Diagnosis present

## 2023-03-12 DIAGNOSIS — O24419 Gestational diabetes mellitus in pregnancy, unspecified control: Secondary | ICD-10-CM

## 2023-03-12 DIAGNOSIS — O34219 Maternal care for unspecified type scar from previous cesarean delivery: Secondary | ICD-10-CM

## 2023-03-12 DIAGNOSIS — Z0289 Encounter for other administrative examinations: Secondary | ICD-10-CM

## 2023-03-12 MED ORDER — METFORMIN HCL 500 MG PO TABS
ORAL_TABLET | ORAL | 5 refills | Status: DC
Start: 2023-03-12 — End: 2023-03-29

## 2023-03-12 NOTE — Procedures (Addendum)
[name] Alvarez April 02, 1996 [redacted]w[redacted]d  Fetus A Non-Stress Test Interpretation for 03/12/23-- NST ONLY  Indication: Gestational Diabetes medication controlled  Fetal Heart Rate A Mode: External Baseline Rate (A): 150 bpm Variability: Moderate Accelerations: 15 x 15 Decelerations: None Multiple birth?: No  Uterine Activity Mode: Toco Contraction Frequency (min): none Resting Tone Palpated: Relaxed  Interpretation (Fetal Testing) Nonstress Test Interpretation: Reactive Overall Impression: Reassuring for gestational age Comments: TRACING REVIEWED BY DR. Darra Lis

## 2023-03-13 ENCOUNTER — Encounter (HOSPITAL_COMMUNITY): Payer: Self-pay | Admitting: *Deleted

## 2023-03-13 ENCOUNTER — Telehealth (HOSPITAL_COMMUNITY): Payer: Self-pay | Admitting: *Deleted

## 2023-03-13 NOTE — Progress Notes (Signed)
   PRENATAL VISIT NOTE  Subjective:  [name] Alvarez is a 27 y.o. [id number] at [redacted]w[redacted]d being seen today for ongoing prenatal care.  She is currently monitored for the following issues for this high-risk pregnancy and has Rubella nonimmune; Sickle cell trait (HCC); History of two cesarean sections complicating pregnancy; Supervision of high risk pregnancy, antepartum; and Gestational diabetes on their problem list.  Patient reports no complaints.  Contractions: Not present. Vag. Bleeding: None.  Movement: Present. Denies leaking of fluid.   The following portions of the patient's history were reviewed and updated as appropriate: allergies, current medications, past family history, past medical history, past social history, past surgical history and problem list.   Objective:   Vitals:   03/12/23 1621  BP: 111/85  Pulse: 84  Weight: 171 lb (77.6 kg)    Fetal Status: Fetal Heart Rate (bpm): 143   Movement: Present     General:  Alert, oriented and cooperative. Patient is in no acute distress.  Skin: Skin is warm and dry. No rash noted.   Cardiovascular: Normal heart rate noted  Respiratory: Normal respiratory effort, no problems with respiration noted  Abdomen: Soft, gravid, appropriate for gestational age.  Pain/Pressure: Absent     Pelvic: Cervical exam deferred        Extremities: Normal range of motion.  Edema: Trace  Mental Status: Normal mood and affect. Normal behavior. Normal judgment and thought content.   Assessment and Plan:  Pregnancy: A2Z3086 at [redacted]w[redacted]d 1. Gestational diabetes mellitus (GDM) in third trimester controlled on oral hypoglycemic drug Pt was taking metformin 500mg  four pills during the day and she thought it was every few hours, which she is doing. AM fastings eleveated in the 100s and a few 1 and 2 hour PPs elevated. I told her I would do two tabs with breakfast and two at bedtime and recommend 38wk IOL, which she prefers over 37wks. Patient set up for 7/9 AM IOL NST  with mfm wnl. I added her on an AFI today which was normal at 18.2, cephalic Continue weekly testing 6/13: 2449gm, efw 51%, ac 88%, afi 6.5, ceph, bpp 8/8 - US OB Limited; Future  2. Supervision of high risk pregnancy, antepartum - GC/Chlamydia probe amp ([place])not at [facility] - Culture, beta strep (group b only)  3. Gestational diabetes mellitus (GDM) in third trimester, gestational diabetes method of control unspecified - metFORMIN (GLUCOPHAGE) 500 MG tablet; 1000mg  (two tabs) with breakfast and 1000mg  (two tabs) right before bed  Dispense: 60 tablet; Refill: 5  4. [redacted] weeks gestation of pregnancy  5. History of two cesarean sections complicating pregnancy Tolac consent signed.   Preterm labor symptoms and general obstetric precautions including but not limited to vaginal bleeding, contractions, leaking of fluid and fetal movement were reviewed in detail with the patient. Please refer to After Visit Summary for other counseling recommendations.   Return in about 6 days (around 03/18/2023) for in office nst/bpp, md visit, high risk ob.  Future Appointments  Date Time Provider Department Center  03/18/2023  1:15 PM [facility] US2 [facility]  03/24/2023  6:45 AM [facility]-LD SCHED ROOM [facility]INDC None  03/26/2023  2:15 PM [facility] NURSE [facility]  03/26/2023  2:30 PM [facility] US3 [facility]    [place] Bing, MD

## 2023-03-13 NOTE — Telephone Encounter (Signed)
Preadmission screen  

## 2023-03-16 LAB — GC/CHLAMYDIA PROBE AMP (~~LOC~~) NOT AT ARMC
Chlamydia: NEGATIVE
Comment: NEGATIVE
Comment: NORMAL
Neisseria Gonorrhea: NEGATIVE

## 2023-03-17 LAB — CULTURE, BETA STREP (GROUP B ONLY): Strep Gp B Culture: POSITIVE — AB

## 2023-03-18 ENCOUNTER — Other Ambulatory Visit: Payer: Self-pay

## 2023-03-18 ENCOUNTER — Encounter: Payer: Self-pay | Admitting: Obstetrics and Gynecology

## 2023-03-18 ENCOUNTER — Ambulatory Visit (INDEPENDENT_AMBULATORY_CARE_PROVIDER_SITE_OTHER): Payer: Medicaid Other

## 2023-03-18 DIAGNOSIS — O24419 Gestational diabetes mellitus in pregnancy, unspecified control: Secondary | ICD-10-CM

## 2023-03-18 DIAGNOSIS — Z3A37 37 weeks gestation of pregnancy: Secondary | ICD-10-CM | POA: Diagnosis not present

## 2023-03-18 DIAGNOSIS — O99820 Streptococcus B carrier state complicating pregnancy: Secondary | ICD-10-CM | POA: Insufficient documentation

## 2023-03-24 ENCOUNTER — Inpatient Hospital Stay (HOSPITAL_COMMUNITY)
Admission: RE | Admit: 2023-03-24 | Discharge: 2023-03-29 | DRG: 788 | Disposition: A | Discharge status: Home / Self Care | Payer: Medicaid Other | Attending: Family Medicine | Admitting: Family Medicine

## 2023-03-24 ENCOUNTER — Encounter (HOSPITAL_COMMUNITY): Payer: Self-pay | Admitting: Obstetrics and Gynecology

## 2023-03-24 ENCOUNTER — Inpatient Hospital Stay (HOSPITAL_COMMUNITY): Payer: Medicaid Other

## 2023-03-24 DIAGNOSIS — O99820 Streptococcus B carrier state complicating pregnancy: Secondary | ICD-10-CM | POA: Diagnosis not present

## 2023-03-24 DIAGNOSIS — O99892 Other specified diseases and conditions complicating childbirth: Secondary | ICD-10-CM

## 2023-03-24 DIAGNOSIS — Z23 Encounter for immunization: Secondary | ICD-10-CM | POA: Diagnosis not present

## 2023-03-24 DIAGNOSIS — Z3A38 38 weeks gestation of pregnancy: Secondary | ICD-10-CM

## 2023-03-24 DIAGNOSIS — O0990 Supervision of high risk pregnancy, unspecified, unspecified trimester: Secondary | ICD-10-CM

## 2023-03-24 DIAGNOSIS — O34219 Maternal care for unspecified type scar from previous cesarean delivery: Secondary | ICD-10-CM | POA: Diagnosis present

## 2023-03-24 DIAGNOSIS — O24425 Gestational diabetes mellitus in childbirth, controlled by oral hypoglycemic drugs: Principal | ICD-10-CM | POA: Diagnosis present

## 2023-03-24 DIAGNOSIS — O99824 Streptococcus B carrier state complicating childbirth: Secondary | ICD-10-CM | POA: Diagnosis present

## 2023-03-24 DIAGNOSIS — D573 Sickle-cell trait: Secondary | ICD-10-CM | POA: Diagnosis present

## 2023-03-24 DIAGNOSIS — O9902 Anemia complicating childbirth: Secondary | ICD-10-CM | POA: Diagnosis present

## 2023-03-24 DIAGNOSIS — Z98891 History of uterine scar from previous surgery: Principal | ICD-10-CM

## 2023-03-24 DIAGNOSIS — O34211 Maternal care for low transverse scar from previous cesarean delivery: Secondary | ICD-10-CM | POA: Diagnosis present

## 2023-03-24 DIAGNOSIS — O24424 Gestational diabetes mellitus in childbirth, insulin controlled: Secondary | ICD-10-CM | POA: Diagnosis not present

## 2023-03-24 DIAGNOSIS — Z2839 Other underimmunization status: Secondary | ICD-10-CM

## 2023-03-24 DIAGNOSIS — O24419 Gestational diabetes mellitus in pregnancy, unspecified control: Secondary | ICD-10-CM | POA: Diagnosis present

## 2023-03-24 LAB — TYPE AND SCREEN

## 2023-03-24 LAB — CBC
HCT: 31.1 % — ABNORMAL LOW (ref 36.0–46.0)
Hemoglobin: 11 g/dL — ABNORMAL LOW (ref 12.0–15.0)
MCH: 31 pg (ref 26.0–34.0)
MCHC: 35.4 g/dL (ref 30.0–36.0)
MCV: 87.6 fL (ref 80.0–100.0)
Platelets: 142 10*3/uL — ABNORMAL LOW (ref 150–400)
RBC: 3.55 MIL/uL — ABNORMAL LOW (ref 3.87–5.11)
RDW: 13.9 % (ref 11.5–15.5)
WBC: 6.1 10*3/uL (ref 4.0–10.5)
nRBC: 0 % (ref 0.0–0.2)

## 2023-03-24 MED ORDER — OXYCODONE-ACETAMINOPHEN 5-325 MG PO TABS: 2.0000 | ORAL_TABLET | ORAL | Status: DC | PRN

## 2023-03-24 MED ORDER — OXYTOCIN-SODIUM CHLORIDE 30-0.9 UT/500ML-% IV SOLN
2.5000 [IU]/h | INTRAVENOUS | Status: DC
  Filled 2023-03-24: qty 500

## 2023-03-24 MED ORDER — OXYTOCIN BOLUS FROM INFUSION: 333.0000 mL | Freq: Once | INTRAVENOUS | Status: DC

## 2023-03-24 MED ORDER — SODIUM CHLORIDE 0.9 % IV SOLN
5.0000 10*6.[IU] | Freq: Once | INTRAVENOUS | Status: AC
  Administered 2023-03-25: 5 10*6.[IU] via INTRAVENOUS
  Filled 2023-03-24: qty 5

## 2023-03-24 MED ORDER — OXYCODONE-ACETAMINOPHEN 5-325 MG PO TABS: 1.0000 | ORAL_TABLET | ORAL | Status: DC | PRN

## 2023-03-24 MED ORDER — LACTATED RINGERS IV SOLN
500.0000 mL | INTRAVENOUS | Status: DC | PRN
  Administered 2023-03-26: 500 mL via INTRAVENOUS

## 2023-03-24 MED ORDER — ONDANSETRON HCL 4 MG/2ML IJ SOLN
4.0000 mg | Freq: Four times a day (QID) | INTRAMUSCULAR | Status: DC | PRN
  Filled 2023-03-24: qty 2

## 2023-03-24 MED ORDER — FENTANYL CITRATE (PF) 100 MCG/2ML IJ SOLN
50.0000 ug | INTRAMUSCULAR | Status: DC | PRN
  Administered 2023-03-25 – 2023-03-26 (×7): 100 ug via INTRAVENOUS
  Filled 2023-03-24 (×7): qty 2

## 2023-03-24 MED ORDER — LACTATED RINGERS IV SOLN: INTRAVENOUS | Status: DC

## 2023-03-24 MED ORDER — ACETAMINOPHEN 325 MG PO TABS
650.0000 mg | ORAL_TABLET | ORAL | Status: DC | PRN
  Administered 2023-03-26: 650 mg via ORAL
  Filled 2023-03-24: qty 2

## 2023-03-24 MED ORDER — PENICILLIN G POT IN DEXTROSE 60000 UNIT/ML IV SOLN
3.0000 10*6.[IU] | INTRAVENOUS | Status: DC
  Administered 2023-03-25 – 2023-03-27 (×7): 3 10*6.[IU] via INTRAVENOUS
  Filled 2023-03-24 (×7): qty 50

## 2023-03-24 MED ORDER — SOD CITRATE-CITRIC ACID 500-334 MG/5ML PO SOLN
30.0000 mL | ORAL | Status: DC | PRN
  Administered 2023-03-27: 30 mL via ORAL
  Filled 2023-03-24: qty 30

## 2023-03-24 MED ORDER — LIDOCAINE HCL (PF) 1 % IJ SOLN: 30.0000 mL | INTRAMUSCULAR | Status: DC | PRN

## 2023-03-25 ENCOUNTER — Encounter (HOSPITAL_COMMUNITY): Payer: Self-pay | Admitting: Anesthesiology

## 2023-03-25 ENCOUNTER — Other Ambulatory Visit: Payer: Self-pay

## 2023-03-25 ENCOUNTER — Encounter (HOSPITAL_COMMUNITY): Payer: Self-pay | Admitting: Obstetrics and Gynecology

## 2023-03-25 LAB — RPR: RPR Ser Ql: NONREACTIVE

## 2023-03-25 LAB — GLUCOSE, CAPILLARY
Glucose-Capillary: 167 mg/dL — ABNORMAL HIGH (ref 70–99)
Glucose-Capillary: 58 mg/dL — ABNORMAL LOW (ref 70–99)
Glucose-Capillary: 63 mg/dL — ABNORMAL LOW (ref 70–99)
Glucose-Capillary: 71 mg/dL (ref 70–99)
Glucose-Capillary: 73 mg/dL (ref 70–99)
Glucose-Capillary: 77 mg/dL (ref 70–99)
Glucose-Capillary: 86 mg/dL (ref 70–99)
Glucose-Capillary: 93 mg/dL (ref 70–99)
Glucose-Capillary: 98 mg/dL (ref 70–99)

## 2023-03-25 LAB — TYPE AND SCREEN

## 2023-03-25 MED ORDER — OXYTOCIN-SODIUM CHLORIDE 30-0.9 UT/500ML-% IV SOLN
1.0000 m[IU]/min | INTRAVENOUS | Status: DC
  Administered 2023-03-25: 1 m[IU]/min via INTRAVENOUS
  Administered 2023-03-26: 12 m[IU]/min via INTRAVENOUS
  Filled 2023-03-25 (×2): qty 500

## 2023-03-25 MED ORDER — CALCIUM CARBONATE ANTACID 500 MG PO CHEW
400.0000 mg | CHEWABLE_TABLET | Freq: Every day | ORAL | Status: DC
  Administered 2023-03-25: 400 mg via ORAL
  Filled 2023-03-25: qty 2

## 2023-03-25 MED ORDER — CALCIUM CARBONATE ANTACID 500 MG PO CHEW: 400.0000 mg | CHEWABLE_TABLET | Freq: Two times a day (BID) | ORAL | Status: DC | PRN

## 2023-03-25 MED ORDER — TERBUTALINE SULFATE 1 MG/ML IJ SOLN: 0.2500 mg | Freq: Once | INTRAMUSCULAR | Status: DC | PRN

## 2023-03-25 NOTE — Anesthesia Preprocedure Evaluation (Signed)
Anesthesia Evaluation    Airway        Dental   Pulmonary neg pulmonary ROS          Cardiovascular negative cardio ROS      Neuro/Psych negative neurological ROS  negative psych ROS   GI/Hepatic Neg liver ROS,GERD  ,,  Endo/Other  diabetes, Well Controlled, Gestational, Oral Hypoglycemic Agents  Obesity  Renal/GU negative Renal ROS  negative genitourinary   Musculoskeletal negative musculoskeletal ROS (+)    Abdominal   Peds  Hematology  (+) Blood dyscrasia, Sickle cell trait and anemia   Anesthesia Other Findings   Reproductive/Obstetrics (+) Pregnancy Previous C/S x 2 GDM                             Anesthesia Physical Anesthesia Plan  ASA: 3  Anesthesia Plan: Epidural   Post-op Pain Management: Minimal or no pain anticipated   Induction:   PONV Risk Score and Plan: Treatment may vary due to age or medical condition  Airway Management Planned: Natural Airway  Additional Equipment: None and Fetal Monitoring  Intra-op Plan:   Post-operative Plan:   Informed Consent:   Plan Discussed with:   Anesthesia Plan Comments:        Anesthesia Quick Evaluation

## 2023-03-25 NOTE — Progress Notes (Signed)
Labor Progress Note  [name] Alvarez is a 27 y.o. [id number] at [redacted]w[redacted]d presented for IOL TOL AC A2 GDM  S: Patient no well, Foley bulb still in place  O:  BP 132/77   Pulse 77   Temp 99.4 F (37.4 C) (Oral)   Resp 16   Ht 5\' 2"  (1.575 m)   Wt 79 kg   LMP 07/01/2022 (Exact Date)   SpO2 99%   BMI 31.86 kg/m  EFM: 140 bpm/Moderate variability/ 15x15 accels/ None decels CAT: 1 Toco: regular, every 1-3 minutes   CVE: Dilation: Fingertip Effacement (%): Thick Station: Ballotable Presentation: Undeterminable Exam by:: Ndulue, MD   A&P: 27 y.o. G6Y4034 [redacted]w[redacted]d  here for IOL as above  #Labor: Progressing well.  Will reassess SVE once Foley bulb comes out #Pain: Family/Friend support and PO/IV pain meds #FWB: CAT 1 #GBS positive-penicillin ongoing # A2 GDM: Last CBG 65, received some juice.  Continue to hold metformin due to clear liquid diet CTM  Myrtie Hawk, DO FMOB Fellow, Faculty practice [facility], Center for [facility] 03/25/23  12:42 PM

## 2023-03-25 NOTE — Progress Notes (Signed)
Labor Progress Note  [name] is a 27 y.o. N8G9562 at [redacted]w[redacted]d presented for IOL TOL AC A2 GDM  S:doing well. No new concerns. Feels contractions but not too intense. Will like to take a shower and freshen up before further intervention.  O:  BP (!) 124/98   Pulse 80   Temp 99.2 F (37.3 C) (Oral)   Resp 18   Ht 5\' 2"  (1.575 m)   Wt 79 kg   LMP 07/01/2022 (Exact Date)   SpO2 99%   BMI 31.86 kg/m   EFM: 155 bpm, moderate variability, + accelerations, no decelerations CAT: 1 Toco: regular, every 1-3 minutes   CVE: Dilation: 2 Effacement (%): 60 Station: Ballotable Presentation: Vertex Exam by:: Lanae Crumbly, DO   A&P: 27 y.o. [name] [redacted]w[redacted]d  here for IOL as above  #Labor: s/p FB, on pitocin. Contractions frequent. Discussed rechecking cervix and AROM. Will like to hold off and take a shower first.  - plan to re-evaluate after she is done with her shower. #Pain: Family/Friend support and PO/IV pain meds #FWB: CAT 1 #GBS positive-penicillin ongoing # A2 GDM: has had low blood sugars X 2, both responsive to juice. Will continue to monitor. Continue to hold off metformin, since on clear liquids.  Davison Ohms Lizabeth Leyden, MD FMOB Fellow, Faculty practice [facility], Center for [facility] Healthcare 03/25/23  8:50 PM

## 2023-03-25 NOTE — H&P (Signed)
OBSTETRIC ADMISSION HISTORY AND PHYSICAL  [name] Alvarez is a 27 y.o. female [phone] with IUP at [redacted]w[redacted]d by LMP presenting for scheduled IOL TOLAC due to A2GDM on metformin. She reports +FMs, No LOF, no VB, no blurry vision, headaches or peripheral edema, and RUQ pain.  She plans on breast and bottle feeding. She is undecided about birth control. She received her [facility] at [facility]. She was consented for a TOLAC, with risks and benefits discussed and signed consent on 01/08/2023.  Dating: By LMP --->  Estimated Date of Delivery: 04/07/23  Sono:    @[redacted]w[redacted]d , CWD, normal anatomy, cephalic presentation, 2449g, 47% EFW   Prenatal History/Complications:  - History of C-section X 2, initial for NRFHT, 2nd was elective repeat. - GDM A2, on metformin 1g BID - GBS positive       Nursing Staff Provider  Office Location [facility] Dating  04/07/2023, by Last Menstrual Period  [facility] Model [ X] Traditional [ ]  Centering [ ]  Mom-Baby Dyad      Language  English Anatomy US  Normal, q4 wks for GDM  Flu Vaccine  declined Genetic/Carrier Screen  NIPS:   low risk AFP:    Horizon: sickle trait, neg 3/4  TDaP Vaccine   01/08/23 Hgb A1C or  GTT Early - normal Third trimester 129/208/198  COVID Vaccine  No   LAB RESULTS   Rhogam  B/Positive/-- (01/25 1117)  Blood Type B/Positive/-- (01/25 1117)   Baby Feeding Plan  Undecided Antibody Negative (01/25 1117)  Contraception undecided Rubella <0.90 (01/25 1117)  Circumcision Yes RPR Non Reactive (01/25 1117)   Pediatrician  List given HBsAg Negative (01/25 1117)   Support Person  Undecided HCVAb Non Reactive (01/25 1117)   Prenatal Classes   HIV Non Reactive (01/25 1117)     BTL Consent   GBS (For PCN allergy, check sensitivities)   VBAC Consent Signed 01/08/2023 Pap       Diagnosis  Date Value Ref Range Status  07/18/2020     Final    - Negative for intraepithelial lesion or malignancy (NILM)             DME Rx [ x] BP cuff [ x] Weight Scale  Waterbirth  [ ]  Class [ ]  Consent [ ]  CNM visit  PH       Past Medical History: Past Medical History:  Diagnosis Date   Gestational diabetes     Past Surgical History: Past Surgical History:  Procedure Laterality Date   CESAREAN SECTION  2013   [place]   CESAREAN SECTION N/A 11/13/2014   Procedure: CESAREAN SECTION;  Surgeon: Catalina Antigua, MD;  Location: [facility];  Service: Obstetrics;  Laterality: N/A;    Obstetrical History: OB History     Gravida  4   Para  2   Term  2   Preterm  0   AB  1   Living  2      SAB  0   IAB  0   Ectopic  0   Multiple  0   Live Births  2           Social History Social History   Socioeconomic History   Marital status: Single    Spouse name: Not on file   Number of children: Not on file   Years of education: Not on file   Highest education level: Some college, no degree  Occupational History   Not on file  Tobacco Use  Smoking status: Never   Smokeless tobacco: Never  Vaping Use   Vaping Use: Never used  Substance and Sexual Activity   Alcohol use: Not Currently    Comment: occ   Drug use: No   Sexual activity: Yes    Birth control/protection: None  Other Topics Concern   Not on file  Social History Narrative   Not on file   Social Determinants of Health   Financial Resource Strain: Low Risk  (11/14/2022)   Overall Financial Resource Strain (CARDIA)    Difficulty of Paying Living Expenses: Not very hard  Food Insecurity: No Food Insecurity (03/24/2023)   Hunger Vital Sign    Worried About Running Out of Food in the Last Year: Never true    Ran Out of Food in the Last Year: Never true  Transportation Needs: No Transportation Needs (03/24/2023)   PRAPARE - Administrator, Civil Service (Medical): No    Lack of Transportation (Non-Medical): No  Physical Activity: Insufficiently Active (11/14/2022)   Exercise Vital Sign    Days of Exercise per Week: 1 day    Minutes of Exercise per Session: 20 min   Stress: No Stress Concern Present (11/14/2022)   [employer] of Occupational Health - Occupational Stress Questionnaire    Feeling of Stress : Not at all  Social Connections: Moderately Integrated (11/14/2022)   Social Connection and Isolation Panel [NHANES]    Frequency of Communication with Friends and Family: More than three times a week    Frequency of Social Gatherings with Friends and Family: Once a week    Attends Religious Services: 1 to 4 times per year    Active Member of [employer] or Organizations: No    Attends Engineer, structural: Not on file    Marital Status: Living with partner    Family History: Family History  Problem Relation Age of Onset   Hypertension Father     Allergies: No Known Allergies  Medications Prior to Admission  Medication Sig Dispense Refill Last Dose   metFORMIN (GLUCOPHAGE) 500 MG tablet 1000mg  (two tabs) with breakfast and 1000mg  (two tabs) right before bed 60 tablet 5 03/23/2023   Prenatal 27-1 MG TABS Take 1 tablet by mouth daily. 30 tablet 11 03/23/2023   Accu-Chek Softclix Lancets lancets Use as instructed QID 100 each 12    Blood Pressure Monitoring (BLOOD PRESSURE KIT) DEVI 1 Device by Does not apply route as needed. (Patient not taking: Reported on 03/05/2023) 1 each 0    glucose blood (ACCU-CHEK GUIDE) test strip Use as instructed QID 100 each 12      Review of Systems   All systems reviewed and negative except as stated in HPI  Blood pressure 130/70, pulse 66, temperature 99.6 F (37.6 C), temperature source Oral, resp. rate 18, height 5\' 2"  (1.575 m), weight 79 kg, last menstrual period 07/01/2022.  General appearance: alert, cooperative, and appears stated age Lungs: clear to auscultation bilaterally Heart: regular rate and rhythm Abdomen: soft, non-tender; bowel sounds normal Pelvic: see below Extremities: Homans sign is negative, no sign of DVT  Presentation: cephalic  Fetal monitoring: 145bpm, moderate  variability, + accelerations, no decelerations  Uterine activity: occasional mild contractions.  Dilation: Closed Effacement (%): Thick Station: Ballotable Exam by:: Marshell Levan, RN   Prenatal labs: ABO, Rh: --/--/B POS (07/09 2300) Antibody: NEG (07/09 2300) Rubella: <0.90 (01/25 1117) RPR: Non Reactive (04/25 0827)  HBsAg: Negative (01/25 1117)  HIV: Non Reactive (04/25 0827)  GBS:  Positive/-- (06/27 0453)  2 hr Glucola elevated Genetic screening  low risk Anatomy US normal  Prenatal Transfer Tool  Maternal Diabetes: Yes:  Diabetes Type:  Diet controlled Genetic Screening: Normal Maternal Ultrasounds/Referrals: Normal Fetal Ultrasounds or other Referrals:  None Maternal Substance Abuse:  No Significant Maternal Medications:  Meds include: Other:  metformin Significant Maternal Lab Results:  Group B Strep positive Number of Prenatal Visits:greater than 3 verified prenatal visits Other Comments:  None  Results for orders placed or performed during the hospital encounter of 03/24/23 (from the past 24 hour(s))  CBC   Collection Time: 03/24/23 11:00 PM  Result Value Ref Range   WBC 6.1 4.0 - 10.5 K/uL   RBC 3.55 (L) 3.87 - 5.11 MIL/uL   Hemoglobin 11.0 (L) 12.0 - 15.0 g/dL   HCT 40.9 (L) 81.1 - 91.4 %   MCV 87.6 80.0 - 100.0 fL   MCH 31.0 26.0 - 34.0 pg   MCHC 35.4 30.0 - 36.0 g/dL   RDW 78.2 95.6 - 21.3 %   Platelets 142 (L) 150 - 400 K/uL   nRBC 0.0 0.0 - 0.2 %  Type and screen [facility]   Collection Time: 03/24/23 11:00 PM  Result Value Ref Range   ABO/RH(D) B POS    Antibody Screen NEG    Sample Expiration      03/27/2023,2359 Performed at [facility], [address], [place], [place] [address]   Glucose, capillary   Collection Time: 03/25/23 12:18 AM  Result Value Ref Range   Glucose-Capillary 167 (H) 70 - 99 mg/dL  Glucose, capillary   Collection Time: 03/25/23  1:37 AM  Result Value Ref Range   Glucose-Capillary 93 70 - 99 mg/dL   Glucose, capillary   Collection Time: 03/25/23  4:43 AM  Result Value Ref Range   Glucose-Capillary 98 70 - 99 mg/dL    Patient Active Problem List   Diagnosis Date Noted   GDM, class A2 03/24/2023   GBS (group B Streptococcus carrier), +RV culture, currently pregnant 03/18/2023   Gestational diabetes 01/09/2023   Supervision of high risk pregnancy, antepartum 10/01/2022   History of two cesarean sections complicating pregnancy 09/13/2014   Sickle cell trait (HCC) 09/11/2014   Rubella nonimmune 09/04/2014    Assessment/Plan:  [name] Alvarez is a 27 y.o. [id number] at [redacted]w[redacted]d here for IOL due to A2GDM, controlled with metformin. She has had 2 previous C-sections, hence a TOLAC. Reports initial c-section was for NRFHT; she had presented in early labor. Unsure of cervical dilation.  #Labor:Admit to labor and delivery. Start pitocin, 1 X 1, given closed cervix. Plan to cap at 6 units. Consider FB at next check.  #Pain: Family support, IV fentanyl, epidural when ready  #FWB: Cat 1 #ID:   GBS positive - start PCN #MOF: breast and bottle #MOC:undecided #Circ:  N/a  Sheppard Evens MD MPH OB Fellow, Faculty Practice [facility], Center for [facility] Healthcare 03/25/2023

## 2023-03-25 NOTE — Progress Notes (Addendum)
Doing well. Feeling mild contractions. Pit @ 6 units.  EFM: 135 bpm / moderate / +accels, no decels Contractions: mild, every 1-2 mins. FB placed with 60cc of fluid, following consent.  Initial BG elevated following a meal, susbequent sugars well controlled  Plan: Ct IOL. Plan to keep pitocin at 6 units until FB out. Recheck after FB out, sooner if needed. Continue CBG q4hrs in latent labor.   Sheppard Evens MD MPH OB Fellow, Faculty Practice [facility], Center for [facility] Healthcare 03/25/2023

## 2023-03-25 NOTE — Progress Notes (Signed)
Hypoglycemic Event  CBG: 58  Treatment: 8 oz juice/soda  Symptoms: None  Follow-up CBG: Time:2107 CBG Result:73  Possible Reasons for Event: Inadequate meal intake  Comments/MD notified: Dr. Etta Grandchild, Lafonda Mosses

## 2023-03-26 ENCOUNTER — Ambulatory Visit: Payer: Medicaid Other

## 2023-03-26 LAB — GLUCOSE, CAPILLARY
Glucose-Capillary: 128 mg/dL — ABNORMAL HIGH (ref 70–99)
Glucose-Capillary: 92 mg/dL (ref 70–99)
Glucose-Capillary: 121 mg/dL — ABNORMAL HIGH (ref 70–99)
Glucose-Capillary: 68 mg/dL — ABNORMAL LOW (ref 70–99)
Glucose-Capillary: 93 mg/dL (ref 70–99)
Glucose-Capillary: 65 mg/dL — ABNORMAL LOW (ref 70–99)
Glucose-Capillary: 65 mg/dL — ABNORMAL LOW (ref 70–99)
Glucose-Capillary: 125 mg/dL — ABNORMAL HIGH (ref 70–99)
Glucose-Capillary: 131 mg/dL — ABNORMAL HIGH (ref 70–99)

## 2023-03-26 MED ORDER — PHENYLEPHRINE 80 MCG/ML (10ML) SYRINGE FOR IV PUSH (FOR BLOOD PRESSURE SUPPORT): 80.0000 ug | PREFILLED_SYRINGE | INTRAVENOUS | Status: DC | PRN

## 2023-03-26 MED ORDER — EPHEDRINE 5 MG/ML INJ: 10.0000 mg | INTRAVENOUS | Status: DC | PRN

## 2023-03-26 MED ORDER — FENTANYL-BUPIVACAINE-NACL 0.5-0.125-0.9 MG/250ML-% EP SOLN
12.0000 mL/h | EPIDURAL | Status: DC | PRN
  Administered 2023-03-27: 12 mL/h via EPIDURAL
  Filled 2023-03-26: qty 250

## 2023-03-26 MED ORDER — LACTATED RINGERS IV SOLN
500.0000 mL | Freq: Once | INTRAVENOUS | Status: AC
  Administered 2023-03-27: 500 mL via INTRAVENOUS

## 2023-03-26 MED ORDER — DIPHENHYDRAMINE HCL 50 MG/ML IJ SOLN: 12.5000 mg | INTRAMUSCULAR | Status: DC | PRN

## 2023-03-26 NOTE — Progress Notes (Signed)
Rechecked patient after her shower.   Doing well. Feels mild cramping.  EFM: cat 1  Cervix: Dilation: 1.5 Effacement (%): 60 Station: Ballotable Presentation: Vertex Exam by:: Dr. Ladon Applebaum  Will restart pitocin at 2 units, since she has had a 1hr break. Discussed option to continue pitocin alone for now, vs trying a new FB again.  She will think about the FB and let us know.  Sheppard Evens MD MPH OB Fellow, Faculty Practice [facility], Center for [facility] Healthcare 03/26/2023

## 2023-03-26 NOTE — Progress Notes (Signed)
Hypoglycemic Event  CBG: 65  Treatment: 4 oz juice/soda  Symptoms: None  Follow-up CBG: Time:1517 CBG Result:65  Possible Reasons for Event: Inadequate meal intake  Comments/MD notified:Notified CNM. Giving 4 more oz. Juice and recheck at 1535. CBG Result: 125    Mahlon Gammon

## 2023-03-26 NOTE — Progress Notes (Signed)
Late Entry d/t Acuity: CNM to patient bedside for introductions. Introduction exchanged. Patient overall doing well.   MD recently placed FB ( please see MD note for Plan of care)   [name] Alvarez) [name] Portela, MSN, CNM  Center for [facility] Healthcare  03/26/2023 12:28 PM

## 2023-03-26 NOTE — Progress Notes (Signed)
Hypoglycemic Event  CBG: 68  Treatment: 8 oz juice/soda  Symptoms: None  Follow-up CBG: Time:1308 CBG Result:93  Possible Reasons for Event: Inadequate meal intake  Comments/MD notified:CNM notified. No new orders    Mahlon Gammon

## 2023-03-26 NOTE — Progress Notes (Signed)
Labor Progress Note [name] Alvarez is a 26 y.o. [id number] at [redacted]w[redacted]d presented for IOL for A2GDM S: FB removed previously. Patient willing to try again  O:  BP (!) 136/95   Pulse 83   Temp 98.9 F (37.2 C) (Oral)   Resp 15   Ht 5\' 2"  (1.575 m)   Wt 79 kg   LMP 07/01/2022 (Exact Date)   SpO2 99%   BMI 31.86 kg/m  EFM: 140/moderate/+accels no decels Toco: q 3-4 min on monitor  CVE: Dilation: 1.5 Effacement (%): 50, 60 Cervical Position: Posterior Station: -3 Presentation: Vertex (confirmed with ultrasound) Exam by:: Dr. Alvester Morin  Placed Cook's FB catheter, inflated 60cc  A&P: 27 y.o. [id number] [redacted]w[redacted]d IOL  #Labor: Progressing slowly, Pit at 4 for ripening. 2nd FB placed. Next step will be ROM #Pain: PRN IV medications #FWB: Cat 1 #GBS positive  #TOLAC: patient is prior CS x2 and desires TOLAC #A2GDM: BG have been WNL  Federico Flake, MD 10:24 AM

## 2023-03-26 NOTE — Progress Notes (Signed)
Labor Progress Note Sally-Ann Cutbirth is a 27 y.o. [id number] at [redacted]w[redacted]d presented for IOL for A2GDM, prior CS x2  S: coping well. Has pain with FB expulsion.   O:  BP 118/76   Pulse 99   Temp 98.4 F (36.9 C) (Axillary)   Resp 15   Ht 5\' 2"  (1.575 m)   Wt 79 kg   LMP 07/01/2022 (Exact Date)   SpO2 99%   BMI 31.86 kg/m  EFM: 155/moderate/+accels, no decel   CVE: Dilation: 3.5 Effacement (%): 50, 60 Cervical Position: Posterior Station: -3 Presentation: Vertex Exam by:: Dr. Alvester Morin  FB came out AROM performed effectively, bloody fluid Placed IUPC, initially tubing filled with blood. Removed and repositioned/tub flushed. Confirmed posterior placenta by [place].   A&P: 27 y.o. [id number] [redacted]w[redacted]d A2GDM, prior CS x2 #Labor: Progressing well. AROM performed successfully. IUPC placed. Start Pitocin 1x1.  #Pain: IV pain medication. Epidural on request #FWB: Cat 1 #GBS positive  #A2GDM: Last BG 93 #TOLAC: monitor closely   Federico Flake, MD 3:35 PM

## 2023-03-27 ENCOUNTER — Other Ambulatory Visit: Payer: Self-pay

## 2023-03-27 ENCOUNTER — Encounter (HOSPITAL_COMMUNITY)
Admission: RE | Disposition: A | Discharge status: Home / Self Care | Payer: Self-pay | Source: Home / Self Care | Attending: Family Medicine

## 2023-03-27 ENCOUNTER — Inpatient Hospital Stay (HOSPITAL_COMMUNITY): Payer: Medicaid Other | Admitting: Anesthesiology

## 2023-03-27 ENCOUNTER — Encounter (HOSPITAL_COMMUNITY): Payer: Self-pay | Admitting: Obstetrics and Gynecology

## 2023-03-27 DIAGNOSIS — O24424 Gestational diabetes mellitus in childbirth, insulin controlled: Secondary | ICD-10-CM

## 2023-03-27 DIAGNOSIS — O99820 Streptococcus B carrier state complicating pregnancy: Secondary | ICD-10-CM

## 2023-03-27 DIAGNOSIS — O34211 Maternal care for low transverse scar from previous cesarean delivery: Secondary | ICD-10-CM

## 2023-03-27 DIAGNOSIS — Z3A38 38 weeks gestation of pregnancy: Secondary | ICD-10-CM

## 2023-03-27 LAB — CBC
HCT: 32 % — ABNORMAL LOW (ref 36.0–46.0)
Hemoglobin: 11.5 g/dL — ABNORMAL LOW (ref 12.0–15.0)
MCH: 31.1 pg (ref 26.0–34.0)
MCHC: 35.9 g/dL (ref 30.0–36.0)
MCV: 86.5 fL (ref 80.0–100.0)
Platelets: 131 10*3/uL — ABNORMAL LOW (ref 150–400)
RBC: 3.7 MIL/uL — ABNORMAL LOW (ref 3.87–5.11)
RDW: 13.8 % (ref 11.5–15.5)
WBC: 13.6 10*3/uL — ABNORMAL HIGH (ref 4.0–10.5)
nRBC: 0 % (ref 0.0–0.2)

## 2023-03-27 LAB — GLUCOSE, CAPILLARY
Glucose-Capillary: 145 mg/dL — ABNORMAL HIGH (ref 70–99)
Glucose-Capillary: 93 mg/dL (ref 70–99)

## 2023-03-27 SURGERY — Surgical Case
Anesthesia: Epidural

## 2023-03-27 MED ORDER — MEASLES, MUMPS & RUBELLA VAC IJ SOLR
0.5000 mL | Freq: Once | INTRAMUSCULAR | Status: AC
  Administered 2023-03-29: 0.5 mL via SUBCUTANEOUS
  Filled 2023-03-27: qty 0.5

## 2023-03-27 MED ORDER — FENTANYL CITRATE (PF) 100 MCG/2ML IJ SOLN
INTRAMUSCULAR | Status: DC | PRN
  Administered 2023-03-27: 100 ug via EPIDURAL

## 2023-03-27 MED ORDER — PHENYLEPHRINE 80 MCG/ML (10ML) SYRINGE FOR IV PUSH (FOR BLOOD PRESSURE SUPPORT)
PREFILLED_SYRINGE | INTRAVENOUS | Status: AC
  Filled 2023-03-27: qty 10

## 2023-03-27 MED ORDER — OXYCODONE HCL 5 MG PO TABS
5.0000 mg | ORAL_TABLET | ORAL | Status: DC | PRN
  Administered 2023-03-29: 5 mg via ORAL

## 2023-03-27 MED ORDER — METHYLERGONOVINE MALEATE 0.2 MG/ML IJ SOLN
INTRAMUSCULAR | Status: DC | PRN
  Administered 2023-03-27: .2 mg via INTRAMUSCULAR

## 2023-03-27 MED ORDER — KETOROLAC TROMETHAMINE 30 MG/ML IJ SOLN: 30.0000 mg | Freq: Four times a day (QID) | INTRAMUSCULAR | Status: AC | PRN

## 2023-03-27 MED ORDER — SCOPOLAMINE 1 MG/3DAYS TD PT72: 1.0000 | MEDICATED_PATCH | Freq: Once | TRANSDERMAL | Status: DC

## 2023-03-27 MED ORDER — ONDANSETRON HCL 4 MG/2ML IJ SOLN
INTRAMUSCULAR | Status: DC | PRN
  Administered 2023-03-27: 4 mg via INTRAVENOUS

## 2023-03-27 MED ORDER — IBUPROFEN 600 MG PO TABS
600.0000 mg | ORAL_TABLET | Freq: Four times a day (QID) | ORAL | Status: DC
  Administered 2023-03-28 – 2023-03-29 (×5): 600 mg via ORAL
  Filled 2023-03-27 (×5): qty 1

## 2023-03-27 MED ORDER — NALOXONE HCL 0.4 MG/ML IJ SOLN: 0.4000 mg | INTRAMUSCULAR | Status: DC | PRN

## 2023-03-27 MED ORDER — MEPERIDINE HCL 25 MG/ML IJ SOLN: 6.2500 mg | INTRAMUSCULAR | Status: DC | PRN

## 2023-03-27 MED ORDER — ACETAMINOPHEN 325 MG PO TABS
650.0000 mg | ORAL_TABLET | ORAL | Status: DC | PRN
  Administered 2023-03-28: 650 mg via ORAL
  Filled 2023-03-27: qty 2

## 2023-03-27 MED ORDER — DIBUCAINE (PERIANAL) 1 % EX OINT: 1.0000 | TOPICAL_OINTMENT | CUTANEOUS | Status: DC | PRN

## 2023-03-27 MED ORDER — NALBUPHINE HCL 10 MG/ML IJ SOLN
5.0000 mg | Freq: Once | INTRAMUSCULAR | Status: AC
  Administered 2023-03-27: 5 mg via INTRAVENOUS
  Filled 2023-03-27: qty 1

## 2023-03-27 MED ORDER — COCONUT OIL OIL: 1.0000 | TOPICAL_OIL | Status: DC | PRN

## 2023-03-27 MED ORDER — TETANUS-DIPHTH-ACELL PERTUSSIS 5-2.5-18.5 LF-MCG/0.5 IM SUSY: 0.5000 mL | PREFILLED_SYRINGE | Freq: Once | INTRAMUSCULAR | Status: DC

## 2023-03-27 MED ORDER — DEXAMETHASONE SODIUM PHOSPHATE 10 MG/ML IJ SOLN
INTRAMUSCULAR | Status: DC | PRN
  Administered 2023-03-27: 10 mg via INTRAVENOUS

## 2023-03-27 MED ORDER — OXYTOCIN-SODIUM CHLORIDE 30-0.9 UT/500ML-% IV SOLN
INTRAVENOUS | Status: DC | PRN
  Administered 2023-03-27: 30 [IU] via INTRAVENOUS

## 2023-03-27 MED ORDER — SENNOSIDES-DOCUSATE SODIUM 8.6-50 MG PO TABS
2.0000 | ORAL_TABLET | Freq: Every day | ORAL | Status: DC
  Administered 2023-03-28 – 2023-03-29 (×2): 2 via ORAL
  Filled 2023-03-27 (×2): qty 2

## 2023-03-27 MED ORDER — TRANEXAMIC ACID-NACL 1000-0.7 MG/100ML-% IV SOLN
INTRAVENOUS | Status: DC | PRN
  Administered 2023-03-27: 1000 mg via INTRAVENOUS

## 2023-03-27 MED ORDER — KETOROLAC TROMETHAMINE 30 MG/ML IJ SOLN
30.0000 mg | Freq: Once | INTRAMUSCULAR | Status: AC | PRN
  Administered 2023-03-27: 30 mg via INTRAVENOUS

## 2023-03-27 MED ORDER — SODIUM CHLORIDE 0.9 % IV SOLN: INTRAVENOUS | Status: DC | PRN

## 2023-03-27 MED ORDER — ACETAMINOPHEN 500 MG PO TABS
1000.0000 mg | ORAL_TABLET | Freq: Four times a day (QID) | ORAL | Status: AC
  Administered 2023-03-27 – 2023-03-28 (×4): 1000 mg via ORAL
  Filled 2023-03-27 (×5): qty 2

## 2023-03-27 MED ORDER — SODIUM CHLORIDE 0.9% FLUSH: 3.0000 mL | INTRAVENOUS | Status: DC | PRN

## 2023-03-27 MED ORDER — LACTATED RINGERS IV SOLN: INTRAVENOUS | Status: DC | PRN

## 2023-03-27 MED ORDER — CEFAZOLIN SODIUM-DEXTROSE 2-4 GM/100ML-% IV SOLN
2.0000 g | Freq: Once | INTRAVENOUS | Status: AC
  Administered 2023-03-27: 2 g via INTRAVENOUS

## 2023-03-27 MED ORDER — SCOPOLAMINE 1 MG/3DAYS TD PT72
MEDICATED_PATCH | TRANSDERMAL | Status: DC | PRN
  Administered 2023-03-27: 1 via TRANSDERMAL

## 2023-03-27 MED ORDER — PRENATAL MULTIVITAMIN CH
1.0000 | ORAL_TABLET | Freq: Every day | ORAL | Status: DC
  Administered 2023-03-27 – 2023-03-29 (×3): 1 via ORAL
  Filled 2023-03-27 (×3): qty 1

## 2023-03-27 MED ORDER — TRANEXAMIC ACID-NACL 1000-0.7 MG/100ML-% IV SOLN
INTRAVENOUS | Status: AC
  Filled 2023-03-27: qty 100

## 2023-03-27 MED ORDER — DIPHENHYDRAMINE HCL 25 MG PO CAPS: 25.0000 mg | ORAL_CAPSULE | ORAL | Status: DC | PRN

## 2023-03-27 MED ORDER — LACTATED RINGERS IV SOLN: INTRAVENOUS | Status: DC

## 2023-03-27 MED ORDER — SODIUM CHLORIDE 0.9 % IR SOLN
Status: DC | PRN
  Administered 2023-03-27: 1

## 2023-03-27 MED ORDER — DIPHENHYDRAMINE HCL 25 MG PO CAPS
25.0000 mg | ORAL_CAPSULE | Freq: Four times a day (QID) | ORAL | Status: DC | PRN
  Administered 2023-03-27: 25 mg via ORAL
  Filled 2023-03-27: qty 1

## 2023-03-27 MED ORDER — MORPHINE SULFATE (PF) 0.5 MG/ML IJ SOLN
INTRAMUSCULAR | Status: AC
  Filled 2023-03-27: qty 10

## 2023-03-27 MED ORDER — DIPHENHYDRAMINE HCL 50 MG/ML IJ SOLN: 12.5000 mg | INTRAMUSCULAR | Status: DC | PRN

## 2023-03-27 MED ORDER — STERILE WATER FOR IRRIGATION IR SOLN
Status: DC | PRN
  Administered 2023-03-27: 1000 mL

## 2023-03-27 MED ORDER — SODIUM BICARBONATE 8.4 % IV SOLN
INTRAVENOUS | Status: DC | PRN
  Administered 2023-03-27 (×2): 5 mL via EPIDURAL

## 2023-03-27 MED ORDER — MENTHOL 3 MG MT LOZG: 1.0000 | LOZENGE | OROMUCOSAL | Status: DC | PRN

## 2023-03-27 MED ORDER — KETOROLAC TROMETHAMINE 30 MG/ML IJ SOLN
30.0000 mg | Freq: Four times a day (QID) | INTRAMUSCULAR | Status: AC
  Administered 2023-03-27 – 2023-03-28 (×3): 30 mg via INTRAVENOUS
  Filled 2023-03-27 (×5): qty 1

## 2023-03-27 MED ORDER — PHENYLEPHRINE HCL (PRESSORS) 10 MG/ML IV SOLN
INTRAVENOUS | Status: DC | PRN
  Administered 2023-03-27: 80 ug via INTRAVENOUS
  Administered 2023-03-27 (×2): 160 ug via INTRAVENOUS
  Administered 2023-03-27: 80 ug via INTRAVENOUS
  Administered 2023-03-27: 160 ug via INTRAVENOUS

## 2023-03-27 MED ORDER — SODIUM CHLORIDE 0.9 % IV SOLN
500.0000 mg | Freq: Once | INTRAVENOUS | Status: AC
  Administered 2023-03-27: 500 mg via INTRAVENOUS

## 2023-03-27 MED ORDER — OXYTOCIN-SODIUM CHLORIDE 30-0.9 UT/500ML-% IV SOLN
2.5000 [IU]/h | INTRAVENOUS | Status: AC
  Administered 2023-03-27: 2.5 [IU]/h via INTRAVENOUS
  Filled 2023-03-27: qty 500

## 2023-03-27 MED ORDER — ONDANSETRON HCL 4 MG/2ML IJ SOLN: 4.0000 mg | Freq: Three times a day (TID) | INTRAMUSCULAR | Status: DC | PRN

## 2023-03-27 MED ORDER — LIDOCAINE HCL (PF) 1 % IJ SOLN
INTRAMUSCULAR | Status: DC | PRN
  Administered 2023-03-27: 10 mL via EPIDURAL

## 2023-03-27 MED ORDER — ENOXAPARIN SODIUM 40 MG/0.4ML IJ SOSY
40.0000 mg | PREFILLED_SYRINGE | INTRAMUSCULAR | Status: DC
  Administered 2023-03-28: 40 mg via SUBCUTANEOUS
  Filled 2023-03-27: qty 0.4

## 2023-03-27 MED ORDER — MEDROXYPROGESTERONE ACETATE 150 MG/ML IM SUSP
150.0000 mg | INTRAMUSCULAR | Status: DC | PRN
  Filled 2023-03-27: qty 1

## 2023-03-27 MED ORDER — SIMETHICONE 80 MG PO CHEW: 80.0000 mg | CHEWABLE_TABLET | ORAL | Status: DC | PRN

## 2023-03-27 MED ORDER — SIMETHICONE 80 MG PO CHEW
80.0000 mg | CHEWABLE_TABLET | Freq: Three times a day (TID) | ORAL | Status: DC
  Administered 2023-03-27 – 2023-03-29 (×6): 80 mg via ORAL
  Filled 2023-03-27 (×6): qty 1

## 2023-03-27 MED ORDER — HYDROMORPHONE HCL 1 MG/ML IJ SOLN: 0.2500 mg | INTRAMUSCULAR | Status: DC | PRN

## 2023-03-27 MED ORDER — OXYCODONE HCL 5 MG PO TABS
5.0000 mg | ORAL_TABLET | Freq: Four times a day (QID) | ORAL | Status: DC | PRN
  Filled 2023-03-27: qty 1

## 2023-03-27 MED ORDER — NALOXONE HCL 4 MG/10ML IJ SOLN: 1.0000 ug/kg/h | INTRAVENOUS | Status: DC | PRN

## 2023-03-27 MED ORDER — FENTANYL CITRATE (PF) 100 MCG/2ML IJ SOLN
INTRAMUSCULAR | Status: AC
  Filled 2023-03-27: qty 2

## 2023-03-27 MED ORDER — KETOROLAC TROMETHAMINE 30 MG/ML IJ SOLN
INTRAMUSCULAR | Status: AC
  Filled 2023-03-27: qty 1

## 2023-03-27 MED ORDER — WITCH HAZEL-GLYCERIN EX PADS: 1.0000 | MEDICATED_PAD | CUTANEOUS | Status: DC | PRN

## 2023-03-27 SURGICAL SUPPLY — 37 items
ADH SKN CLS APL DERMABOND .7 (GAUZE/BANDAGES/DRESSINGS) ×1
CLAMP UMBILICAL CORD (MISCELLANEOUS) ×1 IMPLANT
CLOTH BEACON ORANGE TIMEOUT ST (SAFETY) ×1 IMPLANT
DERMABOND ADVANCED .7 DNX12 (GAUZE/BANDAGES/DRESSINGS) ×1 IMPLANT
DRSG OPSITE POSTOP 4X10 (GAUZE/BANDAGES/DRESSINGS) ×1 IMPLANT
ELECT REM PT RETURN 9FT ADLT (ELECTROSURGICAL) ×1
ELECTRODE REM PT RTRN 9FT ADLT (ELECTROSURGICAL) ×1 IMPLANT
EXTRACTOR VACUUM KIWI (MISCELLANEOUS) IMPLANT
GAUZE PAD ABD 7.5X8 STRL (GAUZE/BANDAGES/DRESSINGS) IMPLANT
GAUZE SPONGE 4X4 12PLY STRL LF (GAUZE/BANDAGES/DRESSINGS) IMPLANT
GLOVE BIOGEL PI IND STRL 7.0 (GLOVE) ×3 IMPLANT
GLOVE ECLIPSE 6.5 STRL STRAW (GLOVE) ×1 IMPLANT
GOWN STRL REUS W/TWL LRG LVL3 (GOWN DISPOSABLE) ×3 IMPLANT
HEMOSTAT ARISTA ABSORB 3G PWDR (HEMOSTASIS) IMPLANT
KIT ABG SYR 3ML LUER SLIP (SYRINGE) IMPLANT
NDL HYPO 25X5/8 SAFETYGLIDE (NEEDLE) IMPLANT
NEEDLE HYPO 25X5/8 SAFETYGLIDE (NEEDLE) IMPLANT
NS IRRIG 1000ML POUR BTL (IV SOLUTION) ×1 IMPLANT
PACK C SECTION WH (CUSTOM PROCEDURE TRAY) ×1 IMPLANT
PAD ABD 7.5X8 STRL (GAUZE/BANDAGES/DRESSINGS) ×1 IMPLANT
PAD OB MATERNITY 4.3X12.25 (PERSONAL CARE ITEMS) ×1 IMPLANT
RTRCTR C-SECT PINK 25CM LRG (MISCELLANEOUS) ×1 IMPLANT
SUT CHROMIC 0 CT 1 (SUTURE) IMPLANT
SUT PLAIN 2 0 XLH (SUTURE) IMPLANT
SUT VIC AB 0 CT1 27 (SUTURE) ×3
SUT VIC AB 0 CT1 27XBRD ANBCTR (SUTURE) ×2 IMPLANT
SUT VIC AB 0 CTX 36 (SUTURE) ×3
SUT VIC AB 0 CTX36XBRD ANBCTRL (SUTURE) ×3 IMPLANT
SUT VIC AB 2-0 CT1 27 (SUTURE) ×1
SUT VIC AB 2-0 CT1 TAPERPNT 27 (SUTURE) ×1 IMPLANT
SUT VIC AB 2-0 SH 27 (SUTURE) ×1
SUT VIC AB 2-0 SH 27XBRD (SUTURE) IMPLANT
SUT VIC AB 3-0 SH 27 (SUTURE) ×1
SUT VIC AB 3-0 SH 27XBRD (SUTURE) ×1 IMPLANT
SUT VIC AB 4-0 KS 27 (SUTURE) ×1 IMPLANT
TOWEL OR 17X24 6PK STRL BLUE (TOWEL DISPOSABLE) ×1 IMPLANT
WATER STERILE IRR 1000ML POUR (IV SOLUTION) ×1 IMPLANT

## 2023-03-27 NOTE — Progress Notes (Signed)
At bedside  FHT: 150, moderate variability- started to note late decels  Pt repositioned, IUPC adjusted, FSE placed- some difficulty with FSE cord; however appears to be decel to 90bpm for approximately .  Pitocin turned off, FHT returned to 150bpm with minimal variability.  Discussed management at this time and recommended to proceed with repeat C-section due to non-reassuring fetal well-being, failed TOLAC.  Risk benefits and alternatives of cesarean section were discussed with the patient including but not limited to infection, bleeding, damage to bowel , bladder and baby with the need for further surgery. Pt voiced understanding and desires to proceed. OR notified  Myna Hidalgo, DO Attending Obstetrician & Gynecologist, [facility] for [facility], [facility]  .

## 2023-03-27 NOTE — Op Note (Signed)
PreOp Diagnosis: 1) IUP @ [redacted]w[redacted]d 2) Prior C-section x 2 3) Fetal intolerance to labor 4) GDMA2 PostOp Diagnosis: same Procedure: repeat C-section Surgeon: Dr. Myna Hidalgo Anesthesia: epidural Complications: none EBL: 593cc UOP: 250cc Fluids: 2000cc  Findings: Female infant from vertex presentation.  Normal uterus, tubes and ovaries.  No significant adhesions noted.  PROCEDURE:  Informed consent was obtained from the patient with risks, benefits, complications, treatment options, and expected outcomes discussed with the patient.  The patient concurred with the proposed plan, giving informed consent with form signed.   The patient was taken to Operating Room, and identified with the procedure verified as C-Section Delivery with Time Out. With induction of anesthesia, the patient was prepped and draped in the usual sterile fashion. A Pfannenstiel incision was made and carried down through the subcutaneous tissue to the fascia. The fascia was incised in the midline and extended transversely. The superior aspect of the fascial incision was grasped with Kochers elevated and the underlying muscle dissected off. The inferior aspect of the facial incision was in similar fashion, grasped elevated and rectus muscles dissected off. The peritoneum was identified and entered. Peritoneal incision was extended longitudinally. Alexis retractor was placed.  The utero-vesical peritoneal reflection was identified and incised transversely with the [facility] scissors, the incision extended laterally, the bladder flap created digitally. A low transverse uterine incision was made and the infants head delivered atraumatically. After the umbilical cord was clamped and cut cord blood was obtained for evaluation.   The placenta was removed intact and appeared normal. The uterine outline, tubes and ovaries appeared normal. The uterine incision was closed with running locked sutures of 0 Vicryl and a second layer of the same  stitch was used in an imbricating fashion.  Excellent hemostasis was obtained. Alexis retractor was removed.  The peritoneum was re-approximated using 2-0 vicryl.  A small area of bleeding was noted at the rectus muscle on the anterior surface of the fascia that was repaired with 2-0 vicryl.  Hemostasis achieved.  Arista placed. The fascia was then reapproximated with running sutures of 0 Vicryl. The subcutaneous tissue was reapproximated with 2-0 plain gut suture.  The skin was closed with 4-0 vicryl in a subcuticular fashion.  Instrument, sponge, and needle counts were correct prior the abdominal closure and at the conclusion of the case. The patient was taken to recovery in stable condition.  Myna Hidalgo, DO Attending Obstetrician & Gynecologist, [facility] for [employer], [facility]

## 2023-03-27 NOTE — Anesthesia Procedure Notes (Signed)
Epidural Patient location during procedure: OB Start time: 03/27/2023 2:13 AM End time: 03/27/2023 2:17 AM  Staffing Anesthesiologist: Leilani Able, MD Performed: anesthesiologist   Preanesthetic Checklist Completed: patient identified, IV checked, site marked, risks and benefits discussed, surgical consent, monitors and equipment checked, pre-op evaluation and timeout performed  Epidural Patient position: sitting Prep: DuraPrep and site prepped and draped Patient monitoring: continuous pulse ox and blood pressure Approach: midline Location: L3-L4 Injection technique: LOR air  Needle:  Needle type: Tuohy  Needle gauge: 17 G Needle length: 9 cm and 9 Needle insertion depth: 7 cm Catheter type: closed end flexible Catheter size: 19 Gauge Catheter at skin depth: 12 cm Test dose: negative and Other  Assessment Events: blood not aspirated, no cerebrospinal fluid, injection not painful, no injection resistance, no paresthesia and negative IV test  Additional Notes Reason for block:procedure for pain

## 2023-03-27 NOTE — Discharge Summary (Signed)
Postpartum Discharge Summary  Date of Service updated***     Patient Name: [name] Alvarez DOB: 02/05/1996 MRN: [id number]  Date of admission: 03/24/2023 Delivery date:03/27/2023 Delivering provider: Myna Hidalgo Date of discharge: 03/27/2023  Admitting diagnosis: GDM, class A2 [O24.419] Intrauterine pregnancy: [redacted]w[redacted]d     Secondary diagnosis:  Principal Problem:   GDM, class A2 Active Problems:   Rubella nonimmune   Sickle cell trait (HCC)   History of two cesarean sections complicating pregnancy   Supervision of high risk pregnancy, antepartum   Gestational diabetes   GBS (group B Streptococcus carrier), +RV culture, currently pregnant  Additional problems: ***    Discharge diagnosis: {DX.:23714}                                              Post partum procedures:{Postpartum procedures:23558} Augmentation: AROM, Pitocin, and IP Foley Complications: None  Hospital course: Induction of Labor With Cesarean Section   27 y.o. yo [phone] at [redacted]w[redacted]d was admitted to the hospital 03/24/2023 for induction of labor due to [facility].  Pregnancy complicated by prior C-section x 2.  Induction was attempted with Foley balloon and Pitocin.  Max cervical dilation was 3-4cm.  Once contractions were adequate, pt noted to have late decels.  The patient went for cesarean section due to  fetal intolerance to labor . Delivery details are as follows: Membrane Rupture Time/Date: 3:30 PM,03/26/2023  Delivery Method:C-Section, Low Transverse Details of operation can be found in separate operative Note.  Patient had a postpartum course complicated by***. She is ambulating, tolerating a regular diet, passing flatus, and urinating well.  Patient is discharged home in stable condition on 03/27/23.      Newborn Data: Birth date:03/27/2023 Birth time:3:42 AM Gender:Female Living status:Living Apgars:7 ,9  Weight:3050 g                               Magnesium Sulfate received: {Mag received:30440022} BMZ  received: {BMZ received:30440023} Rhophylac:{Rhophylac received:30440032} MMR:{MMR:30440033} T-DaP:{Tdap:23962} Flu: {JYN:82956} Transfusion:{Transfusion received:30440034}  Physical exam  Vitals:   03/26/23 2101 03/26/23 2130 03/26/23 2200 03/26/23 2238  BP: 114/87 113/69 117/67 (!) 117/59  Pulse: 92 83 90 91  Resp:      Temp:    99.5 F (37.5 C)  TempSrc:    Axillary  SpO2:      Weight:      Height:       General: {Exam; general:21111117} Lochia: {Desc; appropriate/inappropriate:30686::"appropriate"} Uterine Fundus: {Desc; firm/soft:30687} Incision: {Exam; incision:21111123} DVT Evaluation: {Exam; dvt:2111122} Labs: Lab Results  Component Value Date   WBC 6.1 03/24/2023   HGB 11.0 (L) 03/24/2023   HCT 31.1 (L) 03/24/2023   MCV 87.6 03/24/2023   PLT 142 (L) 03/24/2023       No data to display         Edinburgh Score:     No data to display           After visit meds:  Allergies as of 03/27/2023   No Known Allergies   Med Rec must be completed prior to using this [facility]***        Discharge home in stable condition Infant Feeding: {Baby feeding:23562} Infant Disposition:{CHL IP OB HOME WITH OZHYQM:57846} Discharge instruction: per After Visit Summary and Postpartum booklet. Activity: Advance as tolerated. Pelvic rest for 6 weeks.  Diet: {OB EXBM:84132440} Future Appointments:No future appointments. Follow up Visit:   Please schedule this patient for a {Visit type:23955} postpartum visit in {Postpartum visit:23953} with the following provider: {Provider type:23954}. Additional Postpartum F/U:{PP Procedure:23957}  {Risk level:23960} pregnancy complicated by: {complication:23959} Delivery mode:  C-Section, Low Transverse Anticipated Birth Control:  {Birth Control:23956}   03/27/2023 Sharon Seller, DO

## 2023-03-27 NOTE — Anesthesia Preprocedure Evaluation (Signed)
Anesthesia Evaluation  Patient identified by MRN, date of birth, ID band Patient awake    Reviewed: Allergy & Precautions, H&P , NPO status , Patient's Chart, lab work & pertinent test results  Airway Mallampati: I       Dental no notable dental hx.    Pulmonary neg pulmonary ROS   Pulmonary exam normal        Cardiovascular negative cardio ROS Normal cardiovascular exam     Neuro/Psych negative neurological ROS  negative psych ROS   GI/Hepatic negative GI ROS, Neg liver ROS,,,  Endo/Other  negative endocrine ROSdiabetes, Gestational    Renal/GU negative Renal ROS  negative genitourinary   Musculoskeletal negative musculoskeletal ROS (+)    Abdominal  (+) + obese  Peds  Hematology  (+) Blood dyscrasia, anemia   Anesthesia Other Findings   Reproductive/Obstetrics (+) Pregnancy                             Anesthesia Physical Anesthesia Plan  ASA: II  Anesthesia Plan: Epidural   Post-op Pain Management:    Induction:   PONV Risk Score and Plan:   Airway Management Planned:   Additional Equipment:   Intra-op Plan:   Post-operative Plan:   Informed Consent: I have reviewed the patients History and Physical, chart, labs and discussed the procedure including the risks, benefits and alternatives for the proposed anesthesia with the patient or authorized representative who has indicated his/her understanding and acceptance.       Plan Discussed with:   Anesthesia Plan Comments:         Anesthesia Quick Evaluation

## 2023-03-27 NOTE — Progress Notes (Signed)
OB/GYN Faculty Practice: Labor Progress Note  Subjective: Pt finally started to feel more painful contractions- received epidural for pain management. Now resting comfortably.  Objective: BP (!) 117/59   Pulse 91   Temp 99.5 F (37.5 C) (Axillary)   Resp 16   Ht 5\' 2"  (1.575 m)   Wt 79 kg   LMP 07/01/2022 (Exact Date)   SpO2 99%   BMI 31.86 kg/m  Gen: no acute distress  FHT: 150, moderate variability, +accels, no decels Toco: q 2-36min. MVU ~190 SVE: 3-4/50/-3, IUPC replaced  Assessment and Plan: 27 y.o. [id number] [redacted]w[redacted]d for IOL due to GDMA2, prior C-section x 2  Labor: continue Pitocin -Discussed concern that she has not made any further cervical change and concern for arrest of dilation.  Pt tearful in reviewing plan.  Pt still in latent labor.  Discussed that if FWB remains reassuring and no evidence of infection, will plan to continue with induction.  -recheck in 4-6 hrs, if still no further progress may likely need to review management plan and discuss repeat C-section  -- pain control: epidural  -- Category I - continuous fetal monitoring  -- GBS (positive) PCN per protocol  Myna Hidalgo, DO Attending Obstetrician & Gynecologist, [facility] for [employer], [facility]

## 2023-03-27 NOTE — Transfer of Care (Signed)
Immediate Anesthesia Transfer of Care Note  Patient: [name] Alvarez  Procedure(s) Performed: CESAREAN SECTION  Patient Location: PACU  Anesthesia Type:Epidural  Level of Consciousness: awake, alert , and oriented  Airway & Oxygen Therapy: Patient Spontanous Breathing  Post-op Assessment: Report given to RN and Post -op Vital signs reviewed and stable  Post vital signs: Reviewed and stable  Last Vitals:  Vitals Value Taken Time  BP 126/80   Temp    Pulse 107   Resp 22   SpO2 98     Last Pain:  Vitals:   03/26/23 2238  TempSrc: Axillary  PainSc:          Complications: No notable events documented.

## 2023-03-27 NOTE — Lactation Note (Signed)
This note was copied from a baby's chart. Lactation Consultation Note  Patient Name: [name] Alvarez [id number] Date: 03/27/2023 Age:27 hours Reason for consult: Initial assessment;Early term 37-38.6wks. See Birth Parent's MR: GDM on metformin, Sickle cell trait and C/S delivery.  Per Birth Parent, she has BF International aid/development worker with the [facility] Department.  Per Birth Parent, she feels infant is latching well and she knows to break latch and re-latch infant if she is feeling discomfort. LC did not observe latch, infant recently BF for 30 minutes at 1730 pm. LC reviewed hand expression using breast model and Birth Parent easily expressed colostrum. Per Birth Parent, she briefly BF previous 2 children see maternal data below. Birth Parent will continue to BF infant by cues, on demand, 8 to 12+ times within 24 hours, skin to skin. LC discussed infant's input and output and Per Birth Parent, infant had one void since birth. LC discussed importance of maternal diet, rest and hydration. Birth Parent was  made aware of O/P services, breastfeeding support groups, community resources, and our phone # for post-discharge questions.    Birth Parent feeding plan: 1- Birth Parent will continue to BF infant by cues, on demand, 8 to 12+ times within 24 hours, skin to skin. 2- Birth Parent knows to call RN/LC for latch assistance if needed. 3- Birth Parent knows how to hand express if infant doesn't latch and give back EBM by spoon.  Maternal Data Has patient been taught Hand Expression?: Yes Does the patient have breastfeeding experience prior to this delivery?: Yes How long did the patient breastfeed?: Per Birth Parent, she BF 1st two children for 1 week each but plans to BF 3rd child longer/  Feeding Mother's Current Feeding Choice: Breast Milk  LATCH Score                    Lactation Tools Discussed/Used    Interventions Interventions: Breast feeding basics reviewed;Skin to skin;Hand  express;Education;LC Services brochure  Discharge Pump: Personal (Per Birth Parent, she ordered her DEBP not delivery yet.) WIC Program: Yes  Consult Status Consult Status: Follow-up Date: 03/28/23 Follow-up type: In-patient    Frederico Hamman 03/27/2023, 8:09 PM

## 2023-03-28 NOTE — Lactation Note (Signed)
This note was copied from a baby's chart. Lactation Consultation Note  Patient Name: [name] Alvarez [id number] Date: 03/28/2023 Age:27 hours Reason for consult: Early term 37-38.6wks (weight loss -2.79%) Per Birth Parent, infant mostly been breastfeeding, infant was given formula once today, Birth Parent is continue to work on Building control surveyor well at the breast. LC gave pillow support to bring infant even and in alignment with the breast. Birth Parent latched infant on her right breast using the cradle hold, infant sustained latch and was still breastfeeding after 10 minutes when LC left the room. LC suggested unwrap infant and BF infant skin to skin for first few days of life. Birth Parent will continue to BF infant by cues, on demand, 8 to 12+ times within 24 hours, skin to skin. Birth Parent knows to call RN/LC for further latch assistance if needed. Infant had 5 voids and 4 stools since 0100 am this morning. Birth Parent understands that infant is currently cluster feeding.   Maternal Data    Feeding Mother's Current Feeding Choice: Breast Milk and Formula  LATCH Score Latch: Grasps breast easily, tongue down, lips flanged, rhythmical sucking.  Audible Swallowing: A few with stimulation  Type of Nipple: Everted at rest and after stimulation  Comfort (Breast/Nipple): Soft / non-tender  Hold (Positioning): Assistance needed to correctly position infant at breast and maintain latch.  LATCH Score: 8   Lactation Tools Discussed/Used    Interventions Interventions: Position options;Education;Support pillows;Adjust position;Breast compression;Breast massage;Skin to skin  Discharge    Consult Status Consult Status: Follow-up Date: 03/29/23 Follow-up type: In-patient    Frederico Hamman 03/28/2023, 8:35 PM

## 2023-03-28 NOTE — Progress Notes (Signed)
POSTPARTUM PROGRESS NOTE  POD #1  Subjective:  [name] Alvarez is a 27 y.o. N5A2130 s/p rLTCS at [redacted]w[redacted]d.  She reports she doing well. No acute events overnight. She reports she is doing well. She denies any problems with ambulating, voiding or po intake. Denies nausea or vomiting. She has passed flatus. Pain is well controlled.  Lochia is appropriate.  Objective: Blood pressure 117/71, pulse 81, temperature 98.6 F (37 C), temperature source Oral, resp. rate 18, height 5\' 2"  (1.575 m), weight 79 kg, last menstrual period 07/01/2022, SpO2 99%, unknown if currently breastfeeding.  Physical Exam:  General: alert, cooperative and no distress Chest: no respiratory distress Heart:regular rate, distal pulses intact Abdomen: soft, nontender,  Uterine Fundus: firm, appropriately tender DVT Evaluation: No calf swelling or tenderness Extremities: No LE edema Skin: warm, dry; incision dry w/ pressure dressing in place  Recent Labs    03/27/23 0632  HGB 11.5*  HCT 32.0*    Assessment/Plan: [name] Alvarez is a 27 y.o. [id number] s/p rLTCS at [redacted]w[redacted]d.  POD#1 - Doing well; pain well controlled. H/H appropriate  Routine postpartum care  OOB, ambulated  Lovenox for VTE prophylaxis Anemia: asymptomatic Contraception: Pills Feeding: Both A2GDM CBG 145 -continue metformin at discharge  Dispo: Plan for discharge 7/14-7/15.   LOS: 4 days   Lavonda Jumbo, DO OB Fellow, Faculty [facility], Center for [facility] 03/28/2023, 11:52 AM

## 2023-03-29 ENCOUNTER — Encounter: Payer: Self-pay | Admitting: Obstetrics and Gynecology

## 2023-03-29 MED ORDER — SIMETHICONE 80 MG PO CHEW: 80.0000 mg | CHEWABLE_TABLET | ORAL | 0 refills | Status: DC | PRN

## 2023-03-29 MED ORDER — ACETAMINOPHEN 500 MG PO TABS: 1000.0000 mg | ORAL_TABLET | Freq: Four times a day (QID) | ORAL | Status: AC | PRN

## 2023-03-29 MED ORDER — IBUPROFEN 600 MG PO TABS: 600.0000 mg | ORAL_TABLET | Freq: Four times a day (QID) | ORAL | 20 refills | Status: AC | PRN

## 2023-03-29 MED ORDER — SENNOSIDES-DOCUSATE SODIUM 8.6-50 MG PO TABS: 2.0000 | ORAL_TABLET | Freq: Two times a day (BID) | ORAL | 2 refills | Status: DC | PRN

## 2023-03-29 MED ORDER — OXYCODONE HCL 5 MG PO TABS: 5.0000 mg | ORAL_TABLET | ORAL | 0 refills | Status: DC | PRN

## 2023-03-30 LAB — SURGICAL PATHOLOGY

## 2023-04-02 ENCOUNTER — Other Ambulatory Visit: Payer: Self-pay

## 2023-04-02 ENCOUNTER — Ambulatory Visit (INDEPENDENT_AMBULATORY_CARE_PROVIDER_SITE_OTHER): Payer: Medicaid Other | Admitting: *Deleted

## 2023-04-02 VITALS — BP 144/96 | HR 94 | Ht 62.0 in | Wt 155.2 lb

## 2023-04-02 DIAGNOSIS — Z4889 Encounter for other specified surgical aftercare: Secondary | ICD-10-CM

## 2023-04-02 DIAGNOSIS — R030 Elevated blood-pressure reading, without diagnosis of hypertension: Secondary | ICD-10-CM

## 2023-04-02 NOTE — Progress Notes (Signed)
Pt presents for incision check following C/S on 7/12. She reports taking Ibuprofen and Oxycodone for occasional abdominal pain with good results. She denies pain @ present. Incision was assessed and found to be healing well. No redness, swelling or drainage was observed. Dermabond remains intact. Proper cleansing and incision care was discussed. BP today - 146/95, P - 94.  Recheck after 10 minutes - 144/96.  She denies any occurrence of H/A or visual disturbances. Per consult w/Dr. Briscoe Deutscher, pre-e labs done today and needs repeat BP check in one week. Pt was advised she will be contacted if additional steps in care are indicated once test results have been reviewed. She voiced understanding.

## 2023-04-02 NOTE — Anesthesia Postprocedure Evaluation (Signed)
Anesthesia Post Note  Patient: [name] Alvarez  Procedure(s) Performed: CESAREAN SECTION     Patient location during evaluation: PACU Anesthesia Type: Epidural Level of consciousness: awake Pain management: pain level controlled Vital Signs Assessment: post-procedure vital signs reviewed and stable Respiratory status: spontaneous breathing Cardiovascular status: stable Postop Assessment: no headache, no backache, epidural receding, patient able to bend at knees and no apparent nausea or vomiting Anesthetic complications: no  No notable events documented.  Last Vitals:  Vitals:   03/28/23 2011 03/29/23 0515  BP: 124/80 120/78  Pulse: 92 90  Resp: 18 18  Temp: 36.8 C 36.8 C  SpO2: 100% 100%    Last Pain:  Vitals:   03/29/23 0733  TempSrc:   PainSc: 3    Pain Goal:                   [name]

## 2023-04-03 LAB — COMPREHENSIVE METABOLIC PANEL
ALT: 31 IU/L (ref 0–32)
AST: 20 IU/L (ref 0–40)
Albumin: 3.6 g/dL — ABNORMAL LOW (ref 4.0–5.0)
Alkaline Phosphatase: 117 IU/L (ref 44–121)
BUN/Creatinine Ratio: 11 (ref 9–23)
BUN: 10 mg/dL (ref 6–20)
Bilirubin Total: 0.3 mg/dL (ref 0.0–1.2)
CO2: 19 mmol/L — ABNORMAL LOW (ref 20–29)
Calcium: 8.7 mg/dL (ref 8.7–10.2)
Chloride: 104 mmol/L (ref 96–106)
Creatinine, Ser: 0.91 mg/dL (ref 0.57–1.00)
Globulin, Total: 2.9 g/dL (ref 1.5–4.5)
Glucose: 79 mg/dL (ref 70–99)
Potassium: 4.2 mmol/L (ref 3.5–5.2)
Sodium: 139 mmol/L (ref 134–144)
Total Protein: 6.5 g/dL (ref 6.0–8.5)
eGFR: 89 mL/min/{1.73_m2} (ref >59)

## 2023-04-03 LAB — PROTEIN / CREATININE RATIO, URINE
Creatinine, Urine: 189.2 mg/dL
Protein, Ur: 34.9 mg/dL
Protein/Creat Ratio: 184 mg/g creat (ref 0–200)

## 2023-04-03 LAB — CBC
Hematocrit: 31.6 % — ABNORMAL LOW (ref 34.0–46.6)
Hemoglobin: 10.8 g/dL — ABNORMAL LOW (ref 11.1–15.9)
MCH: 29.3 pg (ref 26.6–33.0)
MCHC: 34.2 g/dL (ref 31.5–35.7)
MCV: 86 fL (ref 79–97)
Platelets: 254 10*3/uL (ref 150–450)
RBC: 3.68 x10E6/uL — ABNORMAL LOW (ref 3.77–5.28)
RDW: 13.6 % (ref 11.7–15.4)
WBC: 6.2 10*3/uL (ref 3.4–10.8)

## 2023-04-09 ENCOUNTER — Ambulatory Visit (INDEPENDENT_AMBULATORY_CARE_PROVIDER_SITE_OTHER): Payer: Medicaid Other | Admitting: *Deleted

## 2023-04-09 ENCOUNTER — Other Ambulatory Visit: Payer: Self-pay

## 2023-04-09 VITALS — BP 127/83 | HR 80

## 2023-04-09 DIAGNOSIS — Z0289 Encounter for other administrative examinations: Secondary | ICD-10-CM

## 2023-04-09 DIAGNOSIS — Z0130 Encounter for examination of blood pressure without abnormal findings: Secondary | ICD-10-CM

## 2023-04-09 NOTE — Progress Notes (Signed)
Pt presents for BP check following elevated BP one week ago. BP today - 127/83, P - 80.  She reports having a H/A x2 days but none today. She denies visual disturbances.  Labs from 7/18 were normal and pt has been notified by Dr. Hyacinth Meeker.  Pt asked for me to check her incision again as she is not able to see it. Incision was assessed and found to be well healed with some Dermabond glue still in place. She will keep PP appt on 8/27 as scheduled.

## 2023-05-12 ENCOUNTER — Encounter: Payer: Self-pay | Admitting: Obstetrics & Gynecology

## 2023-05-12 ENCOUNTER — Ambulatory Visit: Payer: Medicaid Other | Admitting: Family Medicine

## 2023-05-12 ENCOUNTER — Other Ambulatory Visit: Payer: Self-pay

## 2023-05-12 ENCOUNTER — Ambulatory Visit (INDEPENDENT_AMBULATORY_CARE_PROVIDER_SITE_OTHER): Payer: Medicaid Other | Admitting: Obstetrics & Gynecology

## 2023-05-12 VITALS — BP 127/93 | HR 83 | Wt 150.4 lb

## 2023-05-12 DIAGNOSIS — R3915 Urgency of urination: Secondary | ICD-10-CM

## 2023-05-12 DIAGNOSIS — G5600 Carpal tunnel syndrome, unspecified upper limb: Secondary | ICD-10-CM | POA: Diagnosis not present

## 2023-05-12 DIAGNOSIS — Z30011 Encounter for initial prescription of contraceptive pills: Secondary | ICD-10-CM | POA: Diagnosis not present

## 2023-05-12 MED ORDER — NORGESTIM-ETH ESTRAD TRIPHASIC 0.18/0.215/0.25 MG-35 MCG PO TABS
1.0000 | ORAL_TABLET | Freq: Every day | ORAL | 11 refills | Status: AC
Start: 2023-05-12 — End: ?

## 2023-05-12 NOTE — Progress Notes (Unsigned)
    Post Partum Visit Note  Jesika Harvie is a 27 y.o. [phone] female who presents for a postpartum visit. She is 6 weeks postpartum following a repeat cesarean section.  I have fully reviewed the prenatal and intrapartum course. The delivery was at [redacted]w[redacted]d gestational weeks.  Anesthesia: epidural. Postpartum course has been good except bilateral CTS. Baby is doing well. Baby is feeding by bottle - Total Comfort . Bleeding thin lochia. Bowel function is normal. Bladder function is normal. Patient is not sexually active. Contraception method is none. Postpartum depression screening: negative.   The pregnancy intention screening data noted above was reviewed. Potential methods of contraception were discussed. The patient elected to proceed with No data recorded.    Health Maintenance Due  Topic Date Due   HPV VACCINES (2 - 3-dose series) 08/15/2020   COVID-19 Vaccine (1 - 2023-24 season) Never done   INFLUENZA VACCINE  04/16/2023    The following portions of the patient's history were reviewed and updated as appropriate: allergies, current medications, past family history, past medical history, past social history, past surgical history, and problem list.  Review of Systems Musculoskeletal:positive for CTS  Objective:  There were no vitals taken for this visit.   General:  alert, cooperative, and no distress   Breasts:  not indicated  Lungs:   Heart:  regular rate and rhythm  Abdomen: soft, non-tender; bowel sounds normal; no masses,  no organomegaly   Wound well approximated incision  GU exam:  not indicated       Assessment:    There are no diagnoses linked to this encounter.  Normal postpartum exam. CTS sx  Plan:   Essential components of care per ACOG recommendations:  1.  Mood and well being: Patient with negative depression screening today. Reviewed local resources for support.  - Patient tobacco use? No.   - hx of drug use? No.    2. Infant care and feeding:   -Patient currently breastmilk feeding? No.  -Social determinants of health (SDOH) reviewed in EPIC. No concerns  3. Sexuality, contraception and birth spacing - Patient does not want a pregnancy in the next year.  Desired family size is 3 children.  - Reviewed reproductive life planning. Reviewed contraceptive methods based on pt preferences and effectiveness.  Patient desired Oral Contraceptive today.   - Discussed birth spacing of 18 months  4. Sleep and fatigue -Encouraged family/partner/community support of 4 hrs of uninterrupted sleep to help with mood and fatigue  5. Physical Recovery  - Discussed patients delivery and complications. - Patient had a C-section.  - Patient has urinary incontinence? No. - Patient is safe to resume physical and sexual activity  6.  Health Maintenance - HM due items addressed Yes - Last pap smear  Diagnosis  Date Value Ref Range Status  07/18/2020   Final   - Negative for intraepithelial lesion or malignancy (NILM)   Pap smear not done at today's visit.  -Breast Cancer screening indicated? No.   7. Chronic Disease/Pregnancy Condition follow up: {Follow up:25499} PT for CTS - PCP follow up  Adam Phenix, MD  Center for [facility], [facility]

## 2023-05-29 ENCOUNTER — Ambulatory Visit: Payer: Medicaid Other | Admitting: Physical Therapy

## 2023-06-08 ENCOUNTER — Ambulatory Visit: Payer: Medicaid Other | Admitting: Physical Therapy

## 2023-06-18 ENCOUNTER — Encounter: Payer: Self-pay | Admitting: Obstetrics and Gynecology

## 2023-06-18 NOTE — Addendum Note (Signed)
Addended by: Isabell Jarvis on: 06/18/2023 03:41 PM   Modules accepted: Orders

## 2023-06-30 ENCOUNTER — Other Ambulatory Visit: Payer: Self-pay | Admitting: Family Medicine

## 2023-06-30 DIAGNOSIS — R8271 Bacteriuria: Secondary | ICD-10-CM

## 2023-07-02 ENCOUNTER — Other Ambulatory Visit: Payer: Self-pay | Admitting: Family Medicine

## 2023-07-02 DIAGNOSIS — O99891 Other specified diseases and conditions complicating pregnancy: Secondary | ICD-10-CM

## 2023-07-04 ENCOUNTER — Other Ambulatory Visit: Payer: Self-pay | Admitting: Family Medicine

## 2023-07-04 DIAGNOSIS — O99891 Other specified diseases and conditions complicating pregnancy: Secondary | ICD-10-CM

## 2023-10-09 ENCOUNTER — Ambulatory Visit: Payer: Medicaid Other

## 2023-10-09 ENCOUNTER — Other Ambulatory Visit (HOSPITAL_COMMUNITY)
Admission: RE | Admit: 2023-10-09 | Discharge: 2023-10-09 | Disposition: A | Discharge status: Home / Self Care | Payer: Medicaid Other | Source: Ambulatory Visit | Attending: Family Medicine | Admitting: Family Medicine

## 2023-10-09 VITALS — BP 130/80 | HR 68 | Ht 62.0 in | Wt 163.0 lb

## 2023-10-09 DIAGNOSIS — N760 Acute vaginitis: Secondary | ICD-10-CM | POA: Insufficient documentation

## 2023-10-09 NOTE — Progress Notes (Addendum)
[name] Alvarez is here with concern of vaginal itchiness. These symptoms have been present for a few weeks.   Self swab instructions given and specimen obtained. Patient would also like to get STD testing on the vaginal swab. Explained patient will be contacted with any abnormal results. Patient next annual due 04/2024. Patient is due for a pap smear. Patient to schedule pap smear at checkout.   Quintella Reichert, RN 10/09/2023  9:38 AM

## 2023-10-11 LAB — CERVICOVAGINAL ANCILLARY ONLY
Bacterial Vaginitis (gardnerella): NEGATIVE
Candida Glabrata: NEGATIVE
Candida Vaginitis: POSITIVE — AB
Chlamydia: NEGATIVE
Comment: NEGATIVE
Comment: NEGATIVE
Comment: NEGATIVE
Comment: NEGATIVE
Comment: NEGATIVE
Comment: NORMAL
Neisseria Gonorrhea: NEGATIVE
Trichomonas: NEGATIVE

## 2023-10-12 ENCOUNTER — Encounter: Payer: Self-pay | Admitting: Family Medicine

## 2023-10-12 MED ORDER — FLUCONAZOLE 150 MG PO TABS: 150.0000 mg | ORAL_TABLET | Freq: Every day | ORAL | 2 refills | Status: AC

## 2023-10-12 NOTE — Addendum Note (Signed)
Addended by: Reva Bores on: 10/12/2023 08:17 AM   Modules accepted: Orders

## 2023-11-26 ENCOUNTER — Other Ambulatory Visit: Payer: Self-pay

## 2023-11-26 ENCOUNTER — Ambulatory Visit (INDEPENDENT_AMBULATORY_CARE_PROVIDER_SITE_OTHER): Payer: Medicaid Other | Admitting: Obstetrics and Gynecology

## 2023-11-26 ENCOUNTER — Other Ambulatory Visit (HOSPITAL_COMMUNITY)
Admission: RE | Admit: 2023-11-26 | Discharge: 2023-11-26 | Disposition: A | Discharge status: Home / Self Care | Source: Ambulatory Visit | Attending: Obstetrics and Gynecology | Admitting: Obstetrics and Gynecology

## 2023-11-26 VITALS — BP 131/77 | HR 89 | Ht 62.0 in | Wt 158.0 lb

## 2023-11-26 DIAGNOSIS — Z113 Encounter for screening for infections with a predominantly sexual mode of transmission: Secondary | ICD-10-CM | POA: Diagnosis present

## 2023-11-26 DIAGNOSIS — Z0000 Encounter for general adult medical examination without abnormal findings: Secondary | ICD-10-CM

## 2023-11-26 DIAGNOSIS — Z3201 Encounter for pregnancy test, result positive: Secondary | ICD-10-CM | POA: Diagnosis not present

## 2023-11-26 DIAGNOSIS — N939 Abnormal uterine and vaginal bleeding, unspecified: Secondary | ICD-10-CM | POA: Diagnosis not present

## 2023-11-26 LAB — POCT PREGNANCY, URINE: Preg Test, Ur: POSITIVE — AB

## 2023-11-26 NOTE — Progress Notes (Unsigned)
 ANNUAL EXAM Patient name: [name] Alvarez MRN [id number]  Date of birth: 1995/09/20 Chief Complaint:   Gynecologic Exam (Pap smear, STD, birth control-Implant.)  History of Present Illness:   [name] Alvarez is a 28 y.o. [phone] being seen today for a routine annual exam.  Current complaints: annual, birth control  Discussed the use of AI scribe software for clinical note transcription with the patient, who gave verbal consent to proceed.  History of Present Illness   The patient presents for an annual physical exam and Pap smear.  She inquires about starting birth control due to recent changes in her menstrual cycle. After having a baby in July last year, she became pregnant with twins, resulting in a medical abortion. Since then, she has experienced irregular menstrual cycles. Her period was absent for a while post-abortion, then returned with irregularities. Her last period started on the 26th of the previous month, lasted about three days, and resumed about ten days later with lighter, darker bleeding. This pattern has been consistent since she made the appointment. Her periods are more frequent and prolonged, with persistent bleeding described as 'a darker red or discharge' when wiping. No associated pain aside from typical cramping.  She is not currently on any birth control and has not been breastfeeding. She has a history of regular periods even when on birth control for seven years. There have been no significant changes in medication, weight, or new skin or hair texture changes, although she has a history of acne. She is not currently sexually active and is not using any form of birth control. She previously used an IUD for seven years but discontinued it due to hair loss. Last unprotected intercourse was about 3 months ago.        No LMP recorded.   The pregnancy intention screening data noted above was reviewed. Potential methods of contraception were discussed. The patient elected  to proceed with No data recorded.   Last pap     Component Value Date/Time   DIAGPAP  07/18/2020 1450    - Negative for intraepithelial lesion or malignancy (NILM)   HPVHIGH Negative 07/18/2020 1450   ADEQPAP  07/18/2020 1450    Satisfactory for evaluation; transformation zone component PRESENT.     H/O abnormal pap: no Last mammogram: n/a.  Last colonoscopy: n/a.      01/22/2023    7:00 AM 10/09/2022    4:17 PM 10/01/2022   10:36 AM 11/04/2021    4:05 PM 01/30/2021    3:06 PM  Depression screen PHQ 2/9  Decreased Interest 0 0 0 0 0  Down, Depressed, Hopeless 0 0 0 0 0  PHQ - 2 Score 0 0 0 0 0  Altered sleeping  0 1 0 0  Tired, decreased energy  1 3 0 0  Change in appetite  1 3 0 0  Feeling bad or failure about yourself   0 0 0 0  Trouble concentrating  0 0 0 0  Moving slowly or fidgety/restless  0 0 0 0  Suicidal thoughts  0 0 0 0  PHQ-9 Score  2 7 0 0        10/09/2022    4:17 PM 10/01/2022   10:38 AM 11/04/2021    4:05 PM 01/30/2021    3:06 PM  GAD 7 : Generalized Anxiety Score  Nervous, Anxious, on Edge 0 0 0 0  Control/stop worrying 0 0 0 0  Worry too much - different things 0 0 0  0  Trouble relaxing 0 0 0 0  Restless 0 0 0 0  Easily annoyed or irritable 0 1 0 0  Afraid - awful might happen 0 0 0 0  Total GAD 7 Score 0 1 0 0     Review of Systems:   Pertinent items are noted in HPI Denies any headaches, blurred vision, fatigue, shortness of breath, chest pain, abdominal pain, abnormal vaginal discharge/itching/odor/irritation, problems with periods, bowel movements, urination, or intercourse unless otherwise stated above. Pertinent History Reviewed:  Reviewed past medical,surgical, social and family history.  Reviewed problem list, medications and allergies. Physical Assessment:   Vitals:   11/26/23 1005  BP: 131/77  Pulse: 89  SpO2: 98%  Weight: 158 lb (71.7 kg)  Height: 5\' 2"  (1.575 m)  There is no height or weight on file to calculate BMI.         Physical Examination:   General appearance - well appearing, and in no distress  Mental status - alert, oriented to person, place, and time  Psych:  She has a normal mood and affect  Skin - warm and dry, normal color, no suspicious lesions noted  Chest - effort normal, all lung fields clear to auscultation bilaterally  Heart - normal rate and regular rhythm  Abdomen - soft, nontender, nondistended, no masses or organomegaly  Pelvic -  VULVA: normal appearing vulva with no masses, tenderness or lesions   VAGINA: normal appearing vagina with normal color and discharge, no lesions   CERVIX: normal appearing cervix without discharge or lesions, no CMT, deviated to the left  Thin prep pap is done with reflex HR HPV cotesting  UTERUS: uterus is felt to be normal size, shape, consistency and nontender   ADNEXA: No adnexal masses or tenderness noted.  Extremities:  No swelling or varicosities noted  Chaperone present for exam  No results found for this or any previous visit (from the past 24 hours).    Assessment & Plan:   Assessment and Plan    Irregular Menstrual Bleeding Irregular bleeding post-medical abortion. Recommend labs and pelvic ultrasound for further workup - Order pelvic ultrasound to evaluate for retained products or abnormalities. - Perform STI testing to rule out infections.  Contraceptive Counseling Interested in Nexplanon for birth control. Informed consent obtained. Explained insertion process and potential side effects. - Advise backup contraception for first week post-insertion. - Educate on Nexplanon side effects, including irregular bleeding. - Faintly positive UPT, will obtain bHCG today to confirm - will rescheduled nexplanon insertion pending workup  Routine Gynecological Examination Annual exam including Pap smear. No breast or nipple concerns. - Pap smear collected - Conduct breast exam.  Follow-up Requires follow-up for ultrasound and Nexplanon  response. - Schedule ultrasound on Thursday morning.      No orders of the defined types were placed in this encounter.   Meds: No orders of the defined types were placed in this encounter.   Follow-up: No follow-ups on file.  Lorriane Shire, MD 11/26/2023 10:08 AM

## 2023-11-27 ENCOUNTER — Encounter: Payer: Self-pay | Admitting: Obstetrics and Gynecology

## 2023-11-27 LAB — RPR+HBSAG+HCVAB+...
HIV Screen 4th Generation wRfx: NONREACTIVE
Hep C Virus Ab: NONREACTIVE
Hepatitis B Surface Ag: NEGATIVE
RPR Ser Ql: NONREACTIVE

## 2023-11-27 LAB — BETA HCG QUANT (REF LAB): hCG Quant: <1 m[IU]/mL

## 2023-11-27 LAB — HEMOGLOBIN A1C
Est. average glucose Bld gHb Est-mCnc: 103 mg/dL
Hgb A1c MFr Bld: 5.2 % (ref 4.8–5.6)

## 2023-11-27 LAB — TSH RFX ON ABNORMAL TO FREE T4: TSH: 0.554 u[IU]/mL (ref 0.450–4.500)

## 2023-11-27 LAB — PROLACTIN: Prolactin: 6 ng/mL (ref 4.8–33.4)

## 2023-12-01 LAB — CYTOLOGY - PAP
Chlamydia: NEGATIVE
Comment: NEGATIVE
Comment: NEGATIVE
Comment: NORMAL
Diagnosis: NEGATIVE
Neisseria Gonorrhea: NEGATIVE
Trichomonas: NEGATIVE

## 2023-12-03 ENCOUNTER — Ambulatory Visit (HOSPITAL_COMMUNITY)
Admission: RE | Admit: 2023-12-03 | Discharge: 2023-12-03 | Disposition: A | Discharge status: Home / Self Care | Source: Ambulatory Visit | Attending: Obstetrics and Gynecology | Admitting: Obstetrics and Gynecology

## 2023-12-03 DIAGNOSIS — N939 Abnormal uterine and vaginal bleeding, unspecified: Secondary | ICD-10-CM | POA: Diagnosis present

## 2023-12-17 ENCOUNTER — Encounter: Payer: Self-pay | Admitting: Obstetrics and Gynecology

## 2024-01-14 ENCOUNTER — Encounter: Payer: Self-pay | Admitting: Obstetrics and Gynecology

## 2024-01-14 ENCOUNTER — Other Ambulatory Visit: Payer: Self-pay

## 2024-01-14 ENCOUNTER — Ambulatory Visit: Admitting: Obstetrics and Gynecology

## 2024-01-14 VITALS — BP 123/86 | HR 78 | Wt 155.2 lb

## 2024-01-14 DIAGNOSIS — N719 Inflammatory disease of uterus, unspecified: Secondary | ICD-10-CM | POA: Diagnosis not present

## 2024-01-14 DIAGNOSIS — Z30017 Encounter for initial prescription of implantable subdermal contraceptive: Secondary | ICD-10-CM | POA: Diagnosis not present

## 2024-01-14 DIAGNOSIS — Z3202 Encounter for pregnancy test, result negative: Secondary | ICD-10-CM | POA: Diagnosis not present

## 2024-01-14 DIAGNOSIS — N939 Abnormal uterine and vaginal bleeding, unspecified: Secondary | ICD-10-CM | POA: Diagnosis not present

## 2024-01-14 LAB — POCT PREGNANCY, URINE: Preg Test, Ur: NEGATIVE

## 2024-01-14 MED ORDER — ETONOGESTREL 68 MG ~~LOC~~ IMPL
68.0000 mg | DRUG_IMPLANT | Freq: Once | SUBCUTANEOUS | Status: AC
  Administered 2024-01-14: 68 mg via SUBCUTANEOUS

## 2024-01-14 MED ORDER — DOXYCYCLINE HYCLATE 100 MG PO CAPS: 100.0000 mg | ORAL_CAPSULE | Freq: Two times a day (BID) | ORAL | 0 refills | Status: AC

## 2024-01-14 NOTE — Progress Notes (Signed)
 GYNECOLOGY VISIT  Patient name: [name] Alvarez MRN [id number]  Date of birth: 1996/08/13 Chief Complaint:   nexplanon  insertion   History:  [name] Alvarez is a 28 y.o. [phone] being seen today for nexplano insertion. Also notes continued irregular bleeding. Had US  completed and wondering what is cause of the irregular bleeding.    The following portions of the patient's history were reviewed and updated as appropriate: allergies, current medications, past family history, past medical history, past social history, past surgical history and problem list.   Health Maintenance:   Last pap     Component Value Date/Time   DIAGPAP  11/26/2023 1041    - Negative for intraepithelial lesion or malignancy (NILM)   DIAGPAP  07/18/2020 1450    - Negative for intraepithelial lesion or malignancy (NILM)   HPVHIGH Negative 07/18/2020 1450   ADEQPAP  11/26/2023 1041    Satisfactory for evaluation; transformation zone component PRESENT.   ADEQPAP  07/18/2020 1450    Satisfactory for evaluation; transformation zone component PRESENT.   Last mammogram: n/a   Review of Systems:  Pertinent items are noted in HPI. Comprehensive review of systems was otherwise negative.   Objective:  Physical Exam BP 123/86   Pulse 78   Wt 155 lb 4 oz (70.4 kg)   LMP 01/05/2024 (Exact Date)   Breastfeeding No   BMI 28.40 kg/m    Physical Exam Vitals and nursing note reviewed.  Constitutional:      Appearance: Normal appearance.  HENT:     Head: Normocephalic and atraumatic.  Pulmonary:     Effort: Pulmonary effort is normal.  Skin:    General: Skin is warm and dry.  Neurological:     General: No focal deficit present.     Mental Status: She is alert.  Psychiatric:        Mood and Affect: Mood normal.        Behavior: Behavior normal.        Thought Content: Thought content normal.        Judgment: Judgment normal.    Nexplanon  Insertion Procedure Patient identified, informed consent  performed, consent signed.   Patient does understand that irregular bleeding is a very common side effect of this medication. She was advised to have backup contraception for one week after placement. Pregnancy test in clinic today was negative.  Appropriate time out taken.  Patient's left arm was prepped and draped in the usual sterile fashion. The area of insertion was noted on the left arm.  Patient was prepped with alcohol swab and then injected with 3 ml of 1% lidocaine .  She was prepped with betadine, Nexplanon  removed from packaging,  Device confirmed in needle, then inserted full length of needle and withdrawn per handbook instructions. Nexplanon  was able to palpated in the patient's arm. There was minimal blood loss.  Patient insertion site covered with guaze and a pressure bandage to reduce any bruising.  The patient tolerated the procedure well and was given post procedure instructions.       Assessment & Plan:   1. Nexplanon  insertion (Primary) Now status post uncomplicated Nexplanon  insertion.  Reviewed possible bleeding changes.  For 3 years - etonogestrel  (NEXPLANON ) implant 68 mg  2. Abnormal uterine bleeding (AUB) 3. Endometritis On clinical exam abdominal exam is benign, will treat for presumed endometriosis at this time.  Prior AUB labs negative.  Ultrasound unremarkable as well. - doxycycline  (VIBRAMYCIN ) 100 MG capsule; Take 1 capsule (100 mg total) by mouth  2 (two) times daily for 10 days.  Dispense: 20 capsule; Refill: 0   Routine preventative health maintenance measures emphasized.  Kiki Pelton, MD Minimally Invasive Gynecologic Surgery Center for [facility], [facility]

## 2024-03-20 ENCOUNTER — Ambulatory Visit (HOSPITAL_COMMUNITY): Admission: EM | Admit: 2024-03-20 | Discharge: 2024-03-20 | Disposition: A | Discharge status: Home / Self Care

## 2024-03-20 ENCOUNTER — Encounter (HOSPITAL_COMMUNITY): Payer: Self-pay

## 2024-03-20 DIAGNOSIS — R350 Frequency of micturition: Secondary | ICD-10-CM | POA: Insufficient documentation

## 2024-03-20 LAB — POCT URINE PREGNANCY: Preg Test, Ur: NEGATIVE

## 2024-03-20 LAB — POCT URINALYSIS DIP (MANUAL ENTRY)
Bilirubin, UA: NEGATIVE
Blood, UA: NEGATIVE
Glucose, UA: NEGATIVE mg/dL
Ketones, POC UA: NEGATIVE mg/dL
Leukocytes, UA: NEGATIVE
Nitrite, UA: NEGATIVE
Protein Ur, POC: NEGATIVE mg/dL
Spec Grav, UA: 1.02 (ref 1.010–1.025)
Urobilinogen, UA: 0.2 U/dL
pH, UA: 5.5 (ref 5.0–8.0)

## 2024-03-20 NOTE — Discharge Instructions (Addendum)
 We have sent your urine out for culture.  We should get these results in about 3 to 5 days.  If it comes back positive for UTI, we will call you and send in an antibiotic.  We have provided you with information to follow-up with [facility].  This is a center that has a sliding scale for payment and offers discounts for uninsured people.  Please return or go to the emergency department if you develop any fever, vomiting, severe abdominal pain, difficulty breathing, or have any other concerns.

## 2024-03-20 NOTE — ED Triage Notes (Signed)
 Patient here today with c/o frequent urination, low back pain, and flank pain X 1 month.

## 2024-03-20 NOTE — ED Provider Notes (Signed)
 [facility]    CSN: [id number] Arrival date & time: 03/20/24  1012      History   Chief Complaint Chief Complaint  Patient presents with   Urinary Frequency    HPI [name] Alvarez is a 28 y.o. female.   The patient is a 28 year old female who presents to the urgent care today with concerns of urinary frequency, flank pain, and some lower abdominal discomfort/bloating.  She reports her symptoms have been going on for about the past month.  She reports that her symptoms feel similar to when she had a UTI in the past.  She denies any blood in the urine.  She denies any fever, vomiting, severe abdominal pain, history of kidney stones, or other concerns at this time.  She took some ibuprofen  yesterday to help with the back pain.  She does report having gestational diabetes but never had a follow-up appointment for reevaluation after she gave birth.    Past Medical History:  Diagnosis Date   Gestational diabetes     Patient Active Problem List   Diagnosis Date Noted   GDM, class A2 03/24/2023   GBS (group B Streptococcus carrier), +RV culture, currently pregnant 03/18/2023   Supervision of high risk pregnancy, antepartum 10/01/2022   S/P cesarean section 11/13/2014   History of two cesarean sections complicating pregnancy 09/13/2014   Sickle cell trait (HCC) 09/11/2014   Rubella nonimmune 09/04/2014    Past Surgical History:  Procedure Laterality Date   CESAREAN SECTION  2013   [place]   CESAREAN SECTION N/A 11/13/2014   Procedure: CESAREAN SECTION;  Surgeon: Winton Felt, MD;  Location: [facility];  Service: Obstetrics;  Laterality: N/A;   CESAREAN SECTION N/A 03/27/2023   Procedure: CESAREAN SECTION;  Surgeon: Ozan, Jennifer, DO;  Location: [facility];  Service: Obstetrics;  Laterality: N/A;    OB History     Gravida  4   Para  3   Term  3   Preterm  0   AB  1   Living  3      SAB  0   IAB  0   Ectopic  0   Multiple  0   Live Births  3             Home Medications    Prior to Admission medications   Medication Sig Start Date End Date Taking? Authorizing Provider  acetaminophen  (TYLENOL ) 500 MG tablet Take 2 tablets (1,000 mg total) by mouth every 6 (six) hours as needed for mild pain (temperature > 101.5.). 03/29/23   Anyanwu, Ugonna A, MD  Blood Pressure Monitoring (BLOOD PRESSURE KIT) DEVI 1 Device by Does not apply route as needed. Patient not taking: Reported on 01/14/2024 01/22/23   Izell Harari, MD  fluconazole  (DIFLUCAN ) 150 MG tablet Take 1 tablet (150 mg total) by mouth daily. Repeat in 24 hours if needed Patient not taking: Reported on 01/14/2024 10/12/23   Fredirick Glenys RAMAN, MD  ibuprofen  (ADVIL ) 600 MG tablet Take 1 tablet (600 mg total) by mouth every 6 (six) hours as needed for mild pain or moderate pain. 03/29/23   Anyanwu, Ugonna A, MD  Norgestimate-Ethinyl Estradiol Triphasic (TRI-SPRINTEC) 0.18/0.215/0.25 MG-35 MCG tablet Take 1 tablet by mouth daily. Patient not taking: Reported on 10/09/2023 05/12/23   Eveline Lynwood MATSU, MD  Prenatal 27-1 MG TABS Take 1 tablet by mouth daily. 02/10/23   Eldonna Suzen Octave, MD    Family History Family History  Problem Relation Age of  Onset   Hypertension Father     Social History Social History   Tobacco Use   Smoking status: Never   Smokeless tobacco: Never  Vaping Use   Vaping status: Never Used  Substance Use Topics   Alcohol use: Not Currently    Comment: occ   Drug use: No     Allergies   Patient has no known allergies.   Review of Systems Review of Systems See HPI for relevant ROS.  Physical Exam Triage Vital Signs ED Triage Vitals  Encounter Vitals Group     BP 03/20/24 1120 136/88     Girls Systolic BP Percentile --      Girls Diastolic BP Percentile --      Boys Systolic BP Percentile --      Boys Diastolic BP Percentile --      Pulse Rate 03/20/24 1120 78     Resp 03/20/24 1120 16     Temp 03/20/24 1120 98.3 F (36.8 C)     Temp Source  03/20/24 1120 Oral     SpO2 03/20/24 1120 99 %     Weight --      Height --      Head Circumference --      Peak Flow --      Pain Score 03/20/24 1117 6     Pain Loc --      Pain Education --      Exclude from Growth Chart --    No data found.  Updated Vital Signs BP 136/88 (BP Location: Right Arm)   Pulse 78   Temp 98.3 F (36.8 C) (Oral)   Resp 16   LMP 03/15/2024 (Exact Date)   SpO2 99%   Breastfeeding No   Visual Acuity Right Eye Distance:   Left Eye Distance:   Bilateral Distance:    Right Eye Near:   Left Eye Near:    Bilateral Near:     Physical Exam General: Alert and oriented, well-developed/well-nourished, calm, cooperative, no acute distress HEENT: Normocephalic atraumatic, moist mucous membranes, no scleral icterus, trachea midline Lungs: Speaking full sentences, non-labored respirations, no distress Heart: Regular rate and rhythm Abdomen:  Soft, nondistended, no tenderness to palpation Musculoskeletal: Moves all extremities well Back: No CVA tenderness Neurologic: Awake, A&O x4, gait normal Integumentary: Warm, dry, normal for ethnicity, intact, no rash Psychiatric: Appropriate mood & affect  UC Treatments / Results  Labs (all labs ordered are listed, but only abnormal results are displayed) Labs Reviewed  URINE CULTURE  POCT URINALYSIS DIP (MANUAL ENTRY)  POCT URINE PREGNANCY    EKG   Radiology No results found.  Procedures Procedures (including critical care time)  Medications Ordered in UC Medications - No data to display  Initial Impression / Assessment and Plan / UC Course  I have reviewed the triage vital signs and the nursing notes.  Pertinent labs & imaging results that were available during my care of the patient were reviewed by me and considered in my medical decision making (see chart for details).    Patient presents with urinary frequency, flank pain, and lower abdominal discomfort/pressure.  Differential diagnosis  includes: Acute uncomplicated cystitis, pyelonephritis, nephrolithiasis, ureterolithiasis, ectopic pregnancy, ovarian torsion, ovarian cyst, appendicitis, diabetes, interstitial cystitis, including other diagnoses.  History obtained from: Patient.  Plan at this time/rationale: UA, urine culture, hCG.  All ordered tests including imaging and labs were independently reviewed and interpreted by myself. Notable findings: hCG negative, UA negative for leukocytes, nitrites, and blood.  Plan: This patient presents with urinary frequency, flank pain, and lower abdominal discomfort/pressure.  UA did not show any signs of UTI, glucose, or blood.  No systemic symptoms. Not septic. Well appearing. Low suspicion for acute pyelonephritis given lack of fever, CVAT, or systemic features. Low suspicion for kidney stone or infected stone. Hcg negative so unlikely for pregnancy/ectopic pregnancy. Low suspicion for ovarian torsion, PID, or appendicitis.  Sent patient's urine off for culture and we will call the patient if it comes back positive for UTI and prescribe an antibiotic at this time. Patient should follow up with their PCP in the next several days.  Provided patient with information to follow-up with a primary care provider that would be an affordable option for her as she is losing her insurance next week. Return precautions to the urgent care or emergency department were discussed including if symptoms worsen, they start having any systemic symptoms, or if they have any other concerns.  Disposition: Stable to discharge home.  All questions answered to the best of this examiner's ability. Advised to f/u with PCP for further eval and/or reassessment. Patient agrees to plan.  An appropriate evaluation has been performed, and in my medical judgment there is currently no evidence of an immediate life-threatening or surgical condition. Discharge is therefore indicated at this time.  This document was created using  the aid of voice recognition Scientist, clinical (histocompatibility and immunogenetics). Final Clinical Impressions(s) / UC Diagnoses   Final diagnoses:  Urinary frequency     Discharge Instructions      We have sent your urine out for culture.  We should get these results in about 3 to 5 days.  If it comes back positive for UTI, we will call you and send in an antibiotic.  We have provided you with information to follow-up with [facility].  This is a center that has a sliding scale for payment and offers discounts for uninsured people.  Please return or go to the emergency department if you develop any fever, vomiting, severe abdominal pain, difficulty breathing, or have any other concerns.    ED Prescriptions   None    PDMP not reviewed this encounter.   Melonie Locus, PA-C 03/20/24 1201

## 2024-03-21 ENCOUNTER — Ambulatory Visit (HOSPITAL_COMMUNITY): Payer: Self-pay

## 2024-03-21 LAB — URINE CULTURE: Culture: NO GROWTH

## 2024-05-07 ENCOUNTER — Encounter: Payer: Self-pay | Admitting: Obstetrics and Gynecology

## 2024-07-11 ENCOUNTER — Ambulatory Visit: Admitting: Family

## 2024-07-11 VITALS — BP 120/88 | HR 57 | Temp 98.0°F | Resp 16 | Ht 62.0 in | Wt 149.4 lb

## 2024-07-11 DIAGNOSIS — Z7689 Persons encountering health services in other specified circumstances: Secondary | ICD-10-CM

## 2024-07-11 DIAGNOSIS — F418 Other specified anxiety disorders: Secondary | ICD-10-CM

## 2024-07-11 DIAGNOSIS — Z130 Encounter for screening for diseases of the blood and blood-forming organs and certain disorders involving the immune mechanism: Secondary | ICD-10-CM

## 2024-07-11 DIAGNOSIS — F419 Anxiety disorder, unspecified: Secondary | ICD-10-CM

## 2024-07-11 DIAGNOSIS — Z8632 Personal history of gestational diabetes: Secondary | ICD-10-CM

## 2024-07-11 DIAGNOSIS — Z131 Encounter for screening for diabetes mellitus: Secondary | ICD-10-CM

## 2024-07-11 MED ORDER — HYDROXYZINE PAMOATE 25 MG PO CAPS: 25.0000 mg | ORAL_CAPSULE | Freq: Three times a day (TID) | ORAL | 1 refills | Status: DC | PRN

## 2024-07-11 MED ORDER — ESCITALOPRAM OXALATE 5 MG PO TABS: 5.0000 mg | ORAL_TABLET | Freq: Every day | ORAL | 0 refills | Status: DC

## 2024-07-11 NOTE — Progress Notes (Signed)
 Subjective:    [name] Alvarez - 28 y.o. female MRN [id number]  Date of birth: 09-21-1995  HPI  [name] Alvarez is to establish care.   Current issues and/or concerns: - Diabetes screening. History of gestational diabetes in 2024.  - Anxiety depression related to work-life balance. She would like to try medication and a referral to therapist to see if this helps. She denies thoughts of self-harm, suicidal ideations, homicidal ideations. - Requests work accommodations. 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, job title: Financial Trader. States her work environment is cold. States she has been using a heated blanket at work for the last 1 month. States recently her supervisor told her that she can no longer use heated blanket without work accommodation. States she is not eligible to work for home due to she has been employed with the company less than 1 year. I discussed with patient in detail I am unable to write work accommodation due to she has been an established patient less than 90 days ([facility]'s office policy). I discussed with patient in detail she may consider requesting work accommodation to be completed by established Obstetrics/Gynecology due to anemia was recently managed and treated at the same. Patient verbalized understanding/agreement.  ROS per HPI     Health Maintenance:  Health Maintenance Due  Topic Date Due   HPV VACCINES (2 - 3-dose series) 08/15/2020     Past Medical History: Patient Active Problem List   Diagnosis Date Noted   GDM, class A2 03/24/2023   GBS (group B Streptococcus carrier), +RV culture, currently pregnant 03/18/2023   Supervision of high risk pregnancy, antepartum 10/01/2022   S/P cesarean section 11/13/2014   History of two cesarean sections complicating pregnancy 09/13/2014   Sickle cell trait 09/11/2014   Rubella nonimmune 09/04/2014      Social History   reports that she has never smoked. She has never  used smokeless tobacco. She reports that she does not currently use alcohol. She reports that she does not use drugs.   Family History  family history includes Hypertension in her father.   Medications: reviewed and updated   Objective:   Physical Exam BP 120/88   Pulse (!) 57   Temp 98 F (36.7 C) (Oral)   Resp 16   Ht 5' 2 (1.575 m)   Wt 149 lb 6.4 oz (67.8 kg)   LMP 06/15/2024 (Exact Date)   SpO2 99%   Breastfeeding No   BMI 27.33 kg/m   Physical Exam HENT:     Head: Normocephalic and atraumatic.     Nose: Nose normal.     Mouth/Throat:     Mouth: Mucous membranes are moist.     Pharynx: Oropharynx is clear.  Eyes:     Extraocular Movements: Extraocular movements intact.     Conjunctiva/sclera: Conjunctivae normal.     Pupils: Pupils are equal, round, and reactive to light.  Cardiovascular:     Rate and Rhythm: Bradycardia present.     Pulses: Normal pulses.     Heart sounds: Normal heart sounds.  Pulmonary:     Effort: Pulmonary effort is normal.     Breath sounds: Normal breath sounds.  Musculoskeletal:        General: Normal range of motion.     Cervical back: Normal range of motion and neck supple.  Neurological:     General: No focal deficit present.     Mental Status: She is alert and oriented to  person, place, and time.  Psychiatric:        Mood and Affect: Affect is tearful.         Assessment & Plan:  1. Encounter to establish care (Primary) - Patient presents today to establish care. During the interim follow-up with primary provider as scheduled.  - Return for annual physical examination, labs, and health maintenance. Arrive fasting meaning having no food for at least 8 hours prior to appointment. You may have only water  or black coffee. Please take scheduled medications as normal.  2. Diabetes mellitus screening 3. History of gestational diabetes - Routine screening.  - Hemoglobin A1c  4. Anxiety and depression - Patient denies thoughts  of self-harm, suicidal ideations, homicidal ideations. - Escitalopram and Hydroxyzine as prescribed. Counseled on medication adherence/adverse effects.  - Referral to Psychiatry for evaluation/management.  - Follow-up with primary provider as scheduled.  - Ambulatory referral to Psychiatry - escitalopram (LEXAPRO) 5 MG tablet; Take 1 tablet (5 mg total) by mouth daily.  Dispense: 90 tablet; Refill: 0 - hydrOXYzine (VISTARIL) 25 MG capsule; Take 1 capsule (25 mg total) by mouth every 8 (eight) hours as needed.  Dispense: 90 capsule; Refill: 1  5. Screening for deficiency anemia - Routine screening.  - CBC - Iron, TIBC and Ferritin Panel    Patient was given clear instructions to go to Emergency Department or return to medical center if symptoms don't improve, worsen, or new problems develop.The patient verbalized understanding.  I discussed the assessment and treatment plan with the patient. The patient was provided an opportunity to ask questions and all were answered. The patient agreed with the plan and demonstrated an understanding of the instructions.   The patient was advised to call back or seek an in-person evaluation if the symptoms worsen or if the condition fails to improve as anticipated.    Greig Drones, NP 07/11/2024, 3:05 PM Primary Care at [facility]

## 2024-07-11 NOTE — Progress Notes (Signed)
 Tested for diabetes, wants to talk about work accommodations , anemia, patient scored a 13 on the GAD-7

## 2024-07-12 ENCOUNTER — Ambulatory Visit: Payer: Self-pay | Admitting: Family

## 2024-07-12 LAB — HEMOGLOBIN A1C
Est. average glucose Bld gHb Est-mCnc: 100 mg/dL
Hgb A1c MFr Bld: 5.1 % (ref 4.8–5.6)

## 2024-07-12 LAB — IRON,TIBC AND FERRITIN PANEL
Ferritin: 69 ng/mL (ref 15–150)
Iron Saturation: 24 % (ref 15–55)
Iron: 82 ug/dL (ref 27–159)
Total Iron Binding Capacity: 337 ug/dL (ref 250–450)
UIBC: 255 ug/dL (ref 131–425)

## 2024-07-12 LAB — CBC
Hematocrit: 38.1 % (ref 34.0–46.6)
Hemoglobin: 12.3 g/dL (ref 11.1–15.9)
MCH: 29.4 pg (ref 26.6–33.0)
MCHC: 32.3 g/dL (ref 31.5–35.7)
MCV: 91 fL (ref 79–97)
Platelets: 152 x10E3/uL (ref 150–450)
RBC: 4.19 x10E6/uL (ref 3.77–5.28)
RDW: 13.9 % (ref 11.7–15.4)
WBC: 6.1 x10E3/uL (ref 3.4–10.8)

## 2024-07-19 ENCOUNTER — Encounter: Payer: Self-pay | Admitting: Obstetrics and Gynecology

## 2024-07-20 ENCOUNTER — Telehealth: Payer: Self-pay

## 2024-07-20 NOTE — Telephone Encounter (Signed)
 Copied from CRM [id number]. Topic: General - Other >> Jul 20, 2024  1:17 PM Shanda MATSU wrote: Reason for CRM: Patient is calling in to see if her employer provides her paperwork for an accomodation is the provider able to fill it out, is req a call back in regards to this.

## 2024-07-21 NOTE — Telephone Encounter (Signed)
 I called patient and made her aware that she has to schedule an appointment to discuss paperwork with PCP

## 2024-08-08 ENCOUNTER — Encounter: Payer: Self-pay | Admitting: Family

## 2024-08-18 ENCOUNTER — Ambulatory Visit: Admitting: Family

## 2024-08-22 ENCOUNTER — Encounter: Payer: Self-pay | Admitting: Family

## 2024-08-22 ENCOUNTER — Ambulatory Visit (INDEPENDENT_AMBULATORY_CARE_PROVIDER_SITE_OTHER): Admitting: Family

## 2024-08-22 VITALS — BP 123/80 | HR 85 | Temp 99.7°F | Resp 16 | Ht 62.0 in | Wt 145.2 lb

## 2024-08-22 DIAGNOSIS — F32A Depression, unspecified: Secondary | ICD-10-CM | POA: Diagnosis not present

## 2024-08-22 DIAGNOSIS — Z0289 Encounter for other administrative examinations: Secondary | ICD-10-CM | POA: Diagnosis not present

## 2024-08-22 DIAGNOSIS — F419 Anxiety disorder, unspecified: Secondary | ICD-10-CM

## 2024-08-22 MED ORDER — ESCITALOPRAM OXALATE 5 MG PO TABS: 5.0000 mg | ORAL_TABLET | Freq: Every day | ORAL | 0 refills | Status: AC

## 2024-08-22 MED ORDER — HYDROXYZINE PAMOATE 25 MG PO CAPS: 25.0000 mg | ORAL_CAPSULE | Freq: Three times a day (TID) | ORAL | 1 refills | Status: AC | PRN

## 2024-08-22 NOTE — Progress Notes (Signed)
 Paperwork, patient scored a 10 on GAD-7

## 2024-08-22 NOTE — Progress Notes (Signed)
 Patient ID: [name] Alvarez, female    DOB: [birth year]  MRN: [id number]  CC: Follow-Up  Subjective: [name] Alvarez is a 28 y.o. female who presents for follow-up.   Her concerns today include:  Patient presents today for paperwork completion for work accommodations due to states her work environment is cold. During previous office visit on 07/11/2024 I discussed with patient in detail I was unable to complete the requested paperwork (please refer to office visit note from 07/11/2024). Also during the same 07/11/2024 office visit labs were collected which resulted normal (please refer to labs result note from the same). Today patient questions as to wether she can use anxiety depression to complete the work accommodation paperwork. States her goal is to work from home. States she is overall doing well on medication regimen with no issues/concerns. States since previous office visit Psychiatry called to inform her they are not currently accepting new patients. Patient would like new referral to Psychiatry. She denies thoughts of self-harm, suicidal ideations, homicidal ideations.  Patient Active Problem List   Diagnosis Date Noted   GDM, class A2 03/24/2023   GBS (group B Streptococcus carrier), +RV culture, currently pregnant 03/18/2023   Supervision of high risk pregnancy, antepartum 10/01/2022   S/P cesarean section 11/13/2014   History of two cesarean sections complicating pregnancy 09/13/2014   Sickle cell trait 09/11/2014   Rubella nonimmune 09/04/2014     Current Outpatient Medications on File Prior to Visit  Medication Sig Dispense Refill   acetaminophen  (TYLENOL ) 500 MG tablet Take 2 tablets (1,000 mg total) by mouth every 6 (six) hours as needed for mild pain (temperature > 101.5.).     ibuprofen  (ADVIL ) 600 MG tablet Take 1 tablet (600 mg total) by mouth every 6 (six) hours as needed for mild pain or moderate pain. 60 tablet 20   Prenatal 27-1 MG TABS Take 1 tablet by mouth  daily. 30 tablet 11   Blood Pressure Monitoring (BLOOD PRESSURE KIT) DEVI 1 Device by Does not apply route as needed. (Patient not taking: Reported on 01/14/2024) 1 each 0   fluconazole  (DIFLUCAN ) 150 MG tablet Take 1 tablet (150 mg total) by mouth daily. Repeat in 24 hours if needed (Patient not taking: Reported on 01/14/2024) 2 tablet 2   Norgestimate-Ethinyl Estradiol Triphasic (TRI-SPRINTEC) 0.18/0.215/0.25 MG-35 MCG tablet Take 1 tablet by mouth daily. (Patient not taking: Reported on 10/09/2023) 28 tablet 11   No current facility-administered medications on file prior to visit.    No Known Allergies  Social History   Socioeconomic History   Marital status: Single    Spouse name: Not on file   Number of children: Not on file   Years of education: Not on file   Highest education level: Associate degree: occupational, scientist, product/process development, or vocational program  Occupational History   Not on file  Tobacco Use   Smoking status: Never   Smokeless tobacco: Never  Vaping Use   Vaping status: Never Used  Substance and Sexual Activity   Alcohol use: Not Currently    Comment: occ   Drug use: No   Sexual activity: Yes    Birth control/protection: None  Other Topics Concern   Not on file  Social History Narrative   Not on file   Social Drivers of Health   Financial Resource Strain: Low Risk  (07/11/2024)   Overall Financial Resource Strain (CARDIA)    Difficulty of Paying Living Expenses: Not very hard  Food Insecurity: No Food Insecurity (07/11/2024)  Hunger Vital Sign    Worried About Running Out of Food in the Last Year: Never true    Ran Out of Food in the Last Year: Never true  Transportation Needs: No Transportation Needs (07/11/2024)   PRAPARE - Administrator, Civil Service (Medical): No    Lack of Transportation (Non-Medical): No  Physical Activity: Inactive (07/11/2024)   Exercise Vital Sign    Days of Exercise per Week: 0 days    Minutes of Exercise per Session:  Not on file  Stress: No Stress Concern Present (07/11/2024)   [employer] of Occupational Health - Occupational Stress Questionnaire    Feeling of Stress: Not at all  Social Connections: Unknown (07/11/2024)   Social Connection and Isolation Panel    Frequency of Communication with Friends and Family: Twice a week    Frequency of Social Gatherings with Friends and Family: Patient declined    Attends Religious Services: 1 to 4 times per year    Active Member of [employer] or Organizations: No    Attends Banker Meetings: Never    Marital Status: Living with partner  Intimate Partner Violence: Not At Risk (03/24/2023)   Humiliation, Afraid, Rape, and Kick questionnaire    Fear of Current or Ex-Partner: No    Emotionally Abused: No    Physically Abused: No    Sexually Abused: No    Family History  Problem Relation Age of Onset   Hypertension Father     Past Surgical History:  Procedure Laterality Date   CESAREAN SECTION  2013   [place]   CESAREAN SECTION N/A 11/13/2014   Procedure: CESAREAN SECTION;  Surgeon: Winton Felt, MD;  Location: [facility];  Service: Obstetrics;  Laterality: N/A;   CESAREAN SECTION N/A 03/27/2023   Procedure: CESAREAN SECTION;  Surgeon: Ozan, Jennifer, DO;  Location: [facility];  Service: Obstetrics;  Laterality: N/A;    ROS: Review of Systems Negative except as stated above  PHYSICAL EXAM: BP 123/80   Pulse 85   Temp 99.7 F (37.6 C) (Oral)   Resp 16   Ht 5' 2 (1.575 m)   Wt 145 lb 3.2 oz (65.9 kg)   LMP 08/20/2024 (Exact Date)   SpO2 97%   Breastfeeding No   BMI 26.56 kg/m   Physical Exam HENT:     Head: Normocephalic and atraumatic.     Nose: Nose normal.     Mouth/Throat:     Mouth: Mucous membranes are moist.     Pharynx: Oropharynx is clear.  Eyes:     Extraocular Movements: Extraocular movements intact.     Conjunctiva/sclera: Conjunctivae normal.     Pupils: Pupils are equal, round, and reactive to light.   Cardiovascular:     Rate and Rhythm: Normal rate and regular rhythm.     Pulses: Normal pulses.     Heart sounds: Normal heart sounds.  Pulmonary:     Effort: Pulmonary effort is normal.     Breath sounds: Normal breath sounds.  Musculoskeletal:        General: Normal range of motion.     Cervical back: Normal range of motion and neck supple.  Neurological:     General: No focal deficit present.     Mental Status: She is alert and oriented to person, place, and time.  Psychiatric:        Mood and Affect: Affect is tearful.     ASSESSMENT AND PLAN: 1. Encounter for completion of form  with patient (Primary) 2. Anxiety and depression - Building Surveyor Form In Response To An Accommodation Request form and Accommodation Request Medical Assessment Form completed today in office. Forms states 1 workday away from work monthly. Beginning date 08/22/2024. Ending date 02/21/2024. - I discussed with patient Psychiatry to revise/addend form if appropriate for her to work from home and any additional time away from work. Patient verbalized understanding/agreement.  - Patient denies thoughts of self-harm, suicidal ideations, homicidal ideations. - Continue Escitalopram  and Hydroxyzine  as prescribed. Counseled on medication adherence/adverse effects.  - Referral to Psychiatry for evaluation/management. - escitalopram  (LEXAPRO ) 5 MG tablet; Take 1 tablet (5 mg total) by mouth daily.  Dispense: 90 tablet; Refill: 0 - hydrOXYzine  (VISTARIL ) 25 MG capsule; Take 1 capsule (25 mg total) by mouth every 8 (eight) hours as needed.  Dispense: 90 capsule; Refill: 1 - Ambulatory referral to Psychiatry   Patient was given the opportunity to ask questions.  Patient verbalized understanding of the plan and was able to repeat key elements of the plan. Patient was given clear instructions to go to Emergency Department or return to medical center if symptoms don't improve, worsen, or new  problems develop.The patient verbalized understanding.   Orders Placed This Encounter  Procedures   Ambulatory referral to Psychiatry     Requested Prescriptions   Signed Prescriptions Disp Refills   escitalopram  (LEXAPRO ) 5 MG tablet 90 tablet 0    Sig: Take 1 tablet (5 mg total) by mouth daily.   hydrOXYzine  (VISTARIL ) 25 MG capsule 90 capsule 1    Sig: Take 1 capsule (25 mg total) by mouth every 8 (eight) hours as needed.    Return for Follow-up as needed.  [name] JINNY Chute, NP

## 2024-08-25 ENCOUNTER — Telehealth: Payer: Self-pay | Admitting: Emergency Medicine

## 2024-08-25 NOTE — Telephone Encounter (Signed)
 I called patient to get fax number to fax her paperwork back and she stated she will return my call with the number

## 2024-09-22 ENCOUNTER — Ambulatory Visit (HOSPITAL_COMMUNITY)

## 2024-09-22 ENCOUNTER — Telehealth (HOSPITAL_COMMUNITY): Payer: Self-pay

## 2024-09-22 DIAGNOSIS — F321 Major depressive disorder, single episode, moderate: Secondary | ICD-10-CM
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
## 2024-09-29 ENCOUNTER — Encounter (HOSPITAL_COMMUNITY): Payer: Self-pay

## 2024-09-29 ENCOUNTER — Ambulatory Visit (HOSPITAL_COMMUNITY)

## 2024-09-29 DIAGNOSIS — F411 Generalized anxiety disorder: Secondary | ICD-10-CM

## 2024-09-29 DIAGNOSIS — F321 Major depressive disorder, single episode, moderate: Secondary | ICD-10-CM

## 2024-09-29 NOTE — Progress Notes (Deleted)
 Comprehensive Clinical Assessment (CCA) Note Virtual Visit via Video Note  I connected with [name] Alvarez on 09/22/2024 at  1:00 PM EST by a video enabled telemedicine application and verified that I am speaking with the correct person using two identifiers.  Location: Patient: Home Provider: Home Office   I discussed the limitations of evaluation and management by telemedicine and the availability of in person appointments. The patient expressed understanding and agreed to proceed.   I discussed the assessment and treatment plan with the patient. The patient was provided an opportunity to ask questions and all were answered. The patient agreed with the plan and demonstrated an understanding of the instructions.   The patient was advised to call back or seek an in-person evaluation if the symptoms worsen or if the condition fails to improve as anticipated.  I provided 60 minutes of non-face-to-face time during this encounter.   Wanda C Kellyman   09/22/2024 [name] Alvarez [id number] 1:00 PM - 2:00 PM  Chief Complaint:  Chief Complaint  Patient presents with   Anxiety    Pt reports feeling overwhelmed and anxious, with physical symptoms of heart racing and sweating triggered by multiple stressors.   Visit Diagnosis: Generalized Anxiety Disorder   CCA Screening, Triage and Referral (STR)  Patient Reported Information How did you hear about us ? Primary Care  Referral name: Greig Chute, NP  Referral phone number: [phone]  Whom do you see for routine medical problems? Primary Care  Practice/Facility Name: [facility]  Practice/Facility Phone Number: [phone]  What Is the Reason for Your Visit/Call Today?  Management of anxiety symptoms, including heart racing and sweating, related to significant stress and feelings of overwhelm.  How Long Has This Been Causing You Problems? > than 6 months  What Do You Feel Would Help You the Most Today?  Treatment for Depression or other mood problem  Have You Recently Been in Any Inpatient Treatment (Hospital/Detox/Crisis Center/28-Day Program)? No  Have You Ever Received Services From [employer] [facility] Before? Yes  Who Do You See at [facility]? PCP  Have You Recently Had Any Thoughts About Hurting Yourself? No  Are You Planning to Commit Suicide/Harm Yourself At This time? No  Have you Recently Had Thoughts About Hurting Someone [place]? No  Have You Used Any Alcohol or Drugs in the Past 24 Hours? No  Do You Currently Have a Therapist/Psychiatrist? No  Have You Been Recently Discharged From Any Office Practice or Programs? No   CCA Screening Triage Referral Assessment  Patient Determined To Be At Risk for Harm To Self or Others Based on Review of Patient Reported Information or Presenting Complaint? No  Method: No Plan  Availability of Means: No access or NA  Intent: Vague intent or NA  Notification Required: No need or identified person  Are There Guns or Other Weapons in Your Home? No  Are These Weapons Safely Secured?                            No  Does Patient Present under Involuntary Commitment? No  [place] of Residence: [place]  Patient Currently Receiving the Following Services: Not Receiving Services  CCA Biopsychosocial Intake/Chief Complaint:  Pt reports experiencing significant distress related to feeling overwhelmed by multiple responsibilities and a perceived inability to meet the needs of those around her. These challenges are impacting her overall functioning and well-being.  Current Symptoms/Problems: Pt reports episodes of heart racing  and sweating behind the neck, ears, and lower back, which are triggered by feeling overwhelmed when multiple stressors occur simultaneously. She feels unable to meet the needs of those around her, contributing to her sense of being overwhelmed.  Patient Reported Schizophrenia/Schizoaffective Diagnosis in Past: No  Strengths:  Pt has strong caretaking abilities, and maintains stability both in finances and as a homemaker  Preferences: Pt would like to become more productive, increase patience, reduce irritability, improve communication skills, and carry out daily life in a more balanced and humanizing way  Abilities: Pt is eager to learn, is a good driver, a reliable caretaker, an excellent cook, and consistently maintains a well-kept home   Type of Services Patient Feels are Needed: Services requested include coping skill enhancement, improving communication, increasing productivity, support in caring for children and managing the household, and assistance in finding hobbies and engaging in self-care  Initial Clinical Notes/Concerns: Anxiety and depression  Mental Health Symptoms Depression:  Change in energy/activity; Difficulty Concentrating; Fatigue; Hopelessness; Increase/decrease in appetite; Irritability; Tearfulness; Weight gain/loss; Sleep (too much or little)   Duration of Depressive symptoms: Greater than two weeks   Mania:  None   Anxiety:   Restlessness; Difficulty concentrating; Worrying; Irritability   Psychosis:  None   Duration of Psychotic symptoms: No data recorded  Trauma:  Avoids reminders of event; Emotional numbing; Guilt/shame   Obsessions:  None   Compulsions:  None   Inattention:  Avoids/dislikes activities that require focus; Disorganized; Does not follow instructions (not oppositional); Does not seem to listen; Forgetful; Loses things; Fails to pay attention/makes careless mistakes (always but really bad after my 3rd child 51.80 years old now)   Hyperactivity/Impulsivity:  Always on the go; Difficulty waiting turn; Fidgets with hands/feet; Talks excessively (in adulthood)   Oppositional/Defiant Behaviors:  None   Emotional Irregularity:  Chronic feelings of emptiness; Frantic efforts to avoid abandonment; Intense/inappropriate anger   Other Mood/Personality Symptoms:  No  data recorded   Mental Status Exam Appearance and self-care  Stature:  Average   Weight:  Overweight   Clothing:  Neat/clean   Grooming:  Normal   Cosmetic use:  Age appropriate   Posture/gait:  Normal   Motor activity:  Not Remarkable   Sensorium  Attention:  Normal   Concentration:  Normal   Orientation:  X5   Recall/memory:  Normal   Affect and Mood  Affect:  Congruent   Mood:  Depressed   Relating  Eye contact:  Normal   Facial expression:  Depressed   Attitude toward examiner:  Cooperative   Thought and Language  Speech flow: Clear and Coherent   Thought content:  Appropriate to Mood and Circumstances   Preoccupation:  None   Hallucinations:  None   Organization:  No data recorded  [employer] of Knowledge:  Average   Intelligence:  Average   Abstraction:  Normal   Judgement:  Normal   Reality Testing:  Adequate   Insight:  Good   Decision Making:  Normal   Social Functioning  Social Maturity:  No data recorded  Social Judgement:  No data recorded  Stress  Stressors:  No data recorded  Coping Ability:  No data recorded  Skill Deficits:  Self-care   Supports:  No data recorded    Religion:    Leisure/Recreation:    Exercise/Diet:     CCA Employment/Education Employment/Work Situation:    Education:     CCA Family/Childhood History Family and Relationship History:  Childhood History:     Child/Adolescent Assessment:     CCA Substance Use Alcohol/Drug Use: Alcohol / Drug Use Pain Medications: None. Prescriptions: Hydrocine and Escolaxipram Over the Counter: Ibuprofen  when needed History of alcohol / drug use?: No history of alcohol / drug abuse   ASAM's:  Six Dimensions of Multidimensional Assessment  Dimension 1:  Acute Intoxication and/or Withdrawal Potential:      Dimension 2:  Biomedical Conditions and Complications:      Dimension 3:  Emotional, Behavioral, or Cognitive  Conditions and Complications:     Dimension 4:  Readiness to Change:     Dimension 5:  Relapse, Continued use, or Continued Problem Potential:     Dimension 6:  Recovery/Living Environment:     ASAM Severity Score:    ASAM Recommended Level of Treatment:     DSM5 Diagnoses: Patient Active Problem List   Diagnosis Date Noted   GDM, class A2 03/24/2023   GBS (group B Streptococcus carrier), +RV culture, currently pregnant 03/18/2023   Supervision of high risk pregnancy, antepartum 10/01/2022   S/P cesarean section 11/13/2014   History of two cesarean sections complicating pregnancy 09/13/2014   Sickle cell trait 09/11/2014   Rubella nonimmune 09/04/2014    Patient Centered Plan: Patient will be on the following Treatment Plan(s):  Anxiety  Collaboration of Care: Other None  Patient/Guardian was advised Release of Information must be obtained prior to any record release in order to collaborate their care with an outside provider. Patient/Guardian was advised if they have not already done so to contact the registration department to sign all necessary forms in order for us  to release information regarding their care.   Consent: Patient/Guardian gives verbal consent for treatment and assignment of benefits for services provided during this visit. Patient/Guardian expressed understanding and agreed to proceed.   A comprehensive treatment plan will be developed and implemented at the next therapy session scheduled for 09/30/2023.   Wanda C Kellyman, M.S., [facility], [facility]

## 2024-10-01 NOTE — Progress Notes (Unsigned)
" ° °  THERAPIST PROGRESS NOTE  Session Time: ***  Participation Level: {BHH PARTICIPATION LEVEL:22264}  Behavioral Response: {Appearance:22683}{BHH LEVEL OF CONSCIOUSNESS:22305}{BHH MOOD:22306}  Type of Therapy: {CHL AMB BH Type of Therapy:21022741}  Treatment Goals addressed: ***  ProgressTowards Goals: {Progress Towards Goals:21014066}  Interventions: {CHL AMB BH Type of Intervention:21022753}  Summary: [name] Alvarez is a 29 y.o. female who presents with ***.   Suicidal/Homicidal: {BHH YES OR NO:22294}{yes/no/with/without intent/plan:22693}  Therapist Response: ***  Plan: Return again in *** weeks.  Diagnosis: Generalized anxiety disorder  Current moderate episode of major depressive disorder without prior episode (HCC)  Collaboration of Care: {BH OP Collaboration of Care:21014065}  Patient/Guardian was advised Release of Information must be obtained prior to any record release in order to collaborate their care with an outside provider. Patient/Guardian was advised if they have not already done so to contact the registration department to sign all necessary forms in order for us  to release information regarding their care.   Consent: Patient/Guardian gives verbal consent for treatment and assignment of benefits for services provided during this visit. Patient/Guardian expressed understanding and agreed to proceed.   Wanda Kellyman 10/01/2024  "

## 2024-10-01 NOTE — Progress Notes (Unsigned)
" °  THERAPIST PROGRESS NOTE Virtual Visit via Video Note  I connected with Darilyn Das on 09/29/2024 at  1:00 PM EST by a video enabled telemedicine application and verified that I am speaking with the correct person using two identifiers.  Location: Patient: Home Provider: Home Office   I discussed the limitations of evaluation and management by telemedicine and the availability of in person appointments. The patient expressed understanding and agreed to proceed.   I discussed the assessment and treatment plan with the patient. The patient was provided an opportunity to ask questions and all were answered. The patient agreed with the plan and demonstrated an understanding of the instructions.   The patient was advised to call back or seek an in-person evaluation if the symptoms worsen or if the condition fails to improve as anticipated.  I provided 63 minutes of non-face-to-face time during this encounter.   Soua Lenk C Blondell Laperle   Session Time: 1:00 PM - 2:03 PM  Participation Level: Active  Behavioral Response: Neat, Alert, Anxious and Depressed  Type of Therapy: Individual Therapy  Treatment Goals addressed: During this session, treatment goals addressed included helping Terrian identify and utilize effective coping strategies for anxiety management and working toward reducing the frequency and severity of anxiety-related physical symptoms. Kaelynne was encouraged to increase self-awareness of anxiety triggers through consistent symptom tracking and reflection. Additionally, engagement in regular therapy sessions was supported to facilitate ongoing skill development and emotional regulation, all aimed at improving her overall functioning and well-being.  ProgressTowards Goals: Initial  Interventions: CBT, Strength-based, and Reframing  Summary: Janete Quilling is a 29 y.o. female who presents with significant anxiety and distress related to feeling overwhelmed by multiple responsibilities  and a perceived inability to meet the needs of others. Physical symptoms, such as heart racing and sweating, are triggered by stress. The treatment plan focuses on improving anxiety management through the identification and use of coping strategies, reducing the frequency of physical symptoms, and increasing self-awareness of triggers. Interventions include teaching relaxation techniques, maintaining a daily symptom and coping log, and facilitating weekly therapy sessions. Progress will be monitored through SMART goals and regular follow-up, with the overall aim of enhancing Elijahs functioning and well-being..   Suicidal/Homicidal: No without intent/plan  Therapist Response: Therapist validated Onelia's feelings of overwhelm and anxiety, acknowledging the impact on her daily functioning and well-being. The therapist provided psychoeducation on anxiety and stress management, introduced relaxation and coping techniques, and encouraged Myha to begin tracking symptoms and coping strategy use in a daily log. The therapist supported the patient in identifying specific triggers and discussed the importance of setting boundaries to reduce stress. The therapist collaborated with Tali to set realistic, achievable treatment goals and committed to ongoing support through weekly therapy sessions focused on skill-building and problem-solving.  Plan: Return again in 1 weeks.  Diagnosis: Generalized anxiety disorder  Collaboration of Care: Other None  Patient/Guardian was advised Release of Information must be obtained prior to any record release in order to collaborate their care with an outside provider. Patient/Guardian was advised if they have not already done so to contact the registration department to sign all necessary forms in order for us  to release information regarding their care.   Consent: Patient/Guardian gives verbal consent for treatment and assignment of benefits for services provided during  this visit. Patient/Guardian expressed understanding and agreed to proceed.   Daruis Swaim, M.S., [facility], [facility] 09/29/2024  "

## 2024-10-04 NOTE — Progress Notes (Unsigned)
" ° °  THERAPIST PROGRESS NOTE  Session Time: ***  Participation Level: {BHH PARTICIPATION LEVEL:22264}  Behavioral Response: {Appearance:22683}{BHH LEVEL OF CONSCIOUSNESS:22305}{BHH MOOD:22306}  Type of Therapy: {CHL AMB BH Type of Therapy:21022741}  Treatment Goals addressed: ***  ProgressTowards Goals: {Progress Towards Goals:21014066}  Interventions: {CHL AMB BH Type of Intervention:21022753}  Summary: Jessenya Berdan is a 29 y.o. female who presents with ***.   Suicidal/Homicidal: {BHH YES OR NO:22294}{yes/no/with/without intent/plan:22693}  Therapist Response: ***  Plan: Return again in *** weeks.  Diagnosis: Generalized anxiety disorder  Current moderate episode of major depressive disorder without prior episode (HCC)  Collaboration of Care: {BH OP Collaboration of Care:21014065}  Patient/Guardian was advised Release of Information must be obtained prior to any record release in order to collaborate their care with an outside provider. Patient/Guardian was advised if they have not already done so to contact the registration department to sign all necessary forms in order for us  to release information regarding their care.   Consent: Patient/Guardian gives verbal consent for treatment and assignment of benefits for services provided during this visit. Patient/Guardian expressed understanding and agreed to proceed.   Wanda Kellyman 10/04/2024  "
